# Patient Record
Sex: Male | Born: 1970
Health system: Southern US, Community
[De-identification: ages and names within clinical notes are randomized; demographics above are authoritative.]

## PROBLEM LIST (undated history)

## (undated) DIAGNOSIS — I10 Essential (primary) hypertension: Secondary | ICD-10-CM

## (undated) DIAGNOSIS — K219 Gastro-esophageal reflux disease without esophagitis: Secondary | ICD-10-CM

## (undated) DIAGNOSIS — D649 Anemia, unspecified: Secondary | ICD-10-CM

## (undated) HISTORY — DX: Essential (primary) hypertension: I10

## (undated) HISTORY — DX: Anemia, unspecified: D64.9

## (undated) HISTORY — DX: Gastro-esophageal reflux disease without esophagitis: K21.9

## (undated) HISTORY — PX: NO PAST SURGERIES: SHX2092

---

## 2006-02-05 ENCOUNTER — Emergency Department (HOSPITAL_COMMUNITY): Admission: EM | Admit: 2006-02-05 | Discharge: 2006-02-05 | Payer: Self-pay | Admitting: Emergency Medicine

## 2006-10-04 ENCOUNTER — Emergency Department (HOSPITAL_COMMUNITY): Admission: EM | Admit: 2006-10-04 | Discharge: 2006-10-04 | Payer: Self-pay | Admitting: Emergency Medicine

## 2006-11-01 ENCOUNTER — Emergency Department (HOSPITAL_COMMUNITY): Admission: EM | Admit: 2006-11-01 | Discharge: 2006-11-01 | Payer: Self-pay | Admitting: Family Medicine

## 2006-12-22 ENCOUNTER — Emergency Department (HOSPITAL_COMMUNITY): Admission: EM | Admit: 2006-12-22 | Discharge: 2006-12-22 | Payer: Self-pay | Admitting: Emergency Medicine

## 2007-02-10 ENCOUNTER — Emergency Department (HOSPITAL_COMMUNITY): Admission: EM | Admit: 2007-02-10 | Discharge: 2007-02-10 | Payer: Self-pay | Admitting: Emergency Medicine

## 2007-05-15 ENCOUNTER — Emergency Department (HOSPITAL_COMMUNITY): Admission: EM | Admit: 2007-05-15 | Discharge: 2007-05-15 | Payer: Self-pay | Admitting: Emergency Medicine

## 2008-02-29 ENCOUNTER — Ambulatory Visit: Payer: Self-pay | Admitting: Internal Medicine

## 2008-03-05 LAB — CBC WITH DIFFERENTIAL/PLATELET
Basophils Absolute: 0 10*3/uL (ref 0.0–0.1)
HGB: 12.9 g/dL — ABNORMAL LOW (ref 13.0–17.1)
LYMPH%: 35.1 % (ref 14.0–48.0)
MCV: 91.3 fL (ref 81.6–98.0)
MONO%: 9.8 % (ref 0.0–13.0)
NEUT#: 3.2 10*3/uL (ref 1.5–6.5)
RBC: 4.13 10*6/uL — ABNORMAL LOW (ref 4.20–5.71)
RDW: 13 % (ref 11.2–14.6)
WBC: 5.9 10*3/uL (ref 4.0–10.0)

## 2008-03-05 LAB — COMPREHENSIVE METABOLIC PANEL
ALT: 43 U/L (ref 0–53)
AST: 79 U/L — ABNORMAL HIGH (ref 0–37)
Albumin: 4.6 g/dL (ref 3.5–5.2)
Alkaline Phosphatase: 61 U/L (ref 39–117)
BUN: 11 mg/dL (ref 6–23)
Calcium: 9.3 mg/dL (ref 8.4–10.5)
Glucose, Bld: 80 mg/dL (ref 70–99)

## 2008-03-07 ENCOUNTER — Ambulatory Visit (HOSPITAL_COMMUNITY): Admission: RE | Admit: 2008-03-07 | Discharge: 2008-03-07 | Payer: Self-pay | Admitting: Internal Medicine

## 2008-03-21 LAB — FECAL OCCULT BLOOD, GUAIAC: Occult Blood: NEGATIVE

## 2008-03-21 LAB — CBC WITH DIFFERENTIAL/PLATELET
Eosinophils Absolute: 0.1 10*3/uL (ref 0.0–0.5)
HCT: 38.1 % — ABNORMAL LOW (ref 38.7–49.9)
MCH: 31.1 pg (ref 28.0–33.4)
MONO#: 0.5 10*3/uL (ref 0.1–0.9)
RBC: 4.15 10*6/uL — ABNORMAL LOW (ref 4.20–5.71)
RDW: 13.1 % (ref 11.2–14.6)
WBC: 5.1 10*3/uL (ref 4.0–10.0)

## 2008-03-21 LAB — LACTATE DEHYDROGENASE: LDH: 160 U/L (ref 94–250)

## 2008-03-25 LAB — HEMOGLOBINOPATHY EVALUATION
Hgb A2 Quant: 2.3 % (ref 2.2–3.2)
Hgb F Quant: 0 % (ref 0.0–2.0)
Hgb S Quant: 0 % (ref 0.0–0.0)

## 2008-05-29 ENCOUNTER — Emergency Department (HOSPITAL_COMMUNITY): Admission: EM | Admit: 2008-05-29 | Discharge: 2008-05-29 | Payer: Self-pay | Admitting: Emergency Medicine

## 2008-07-04 ENCOUNTER — Emergency Department (HOSPITAL_COMMUNITY): Admission: EM | Admit: 2008-07-04 | Discharge: 2008-07-04 | Payer: Self-pay | Admitting: Emergency Medicine

## 2008-08-16 ENCOUNTER — Ambulatory Visit: Payer: Self-pay | Admitting: Internal Medicine

## 2009-09-09 IMAGING — CT CT CHEST W/ CM
2 of 5 series · 17 of 46 positions shown, 19 images · IV contrast (agent unspecified)
Comparison: None

CT CHEST

CLINICAL DATA: Diffuse lymphadenopathy.

CT CHEST, ABDOMEN AND PELVIS WITH CONTRAST
TECHNIQUE: Multidetector CT imaging of the chest, abdomen and
pelvis was performed following the standard protocol during bolus
administration of intravenous contrast.
Contrast: 100 ml [id number].

[Series 2: cap 5.0 b40f · axial · 0.80mm/px · z∈[-683,-118]mm · 14 of 129 slices shown, 16 images]
[im 8/129  soft-tissue]
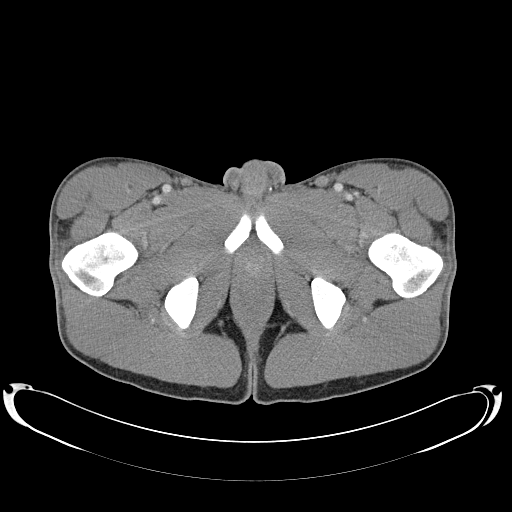
[im 8/129  bone]
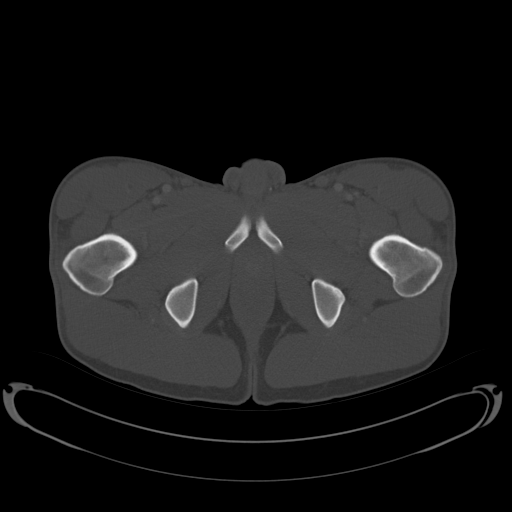
[im 15/129  soft-tissue]
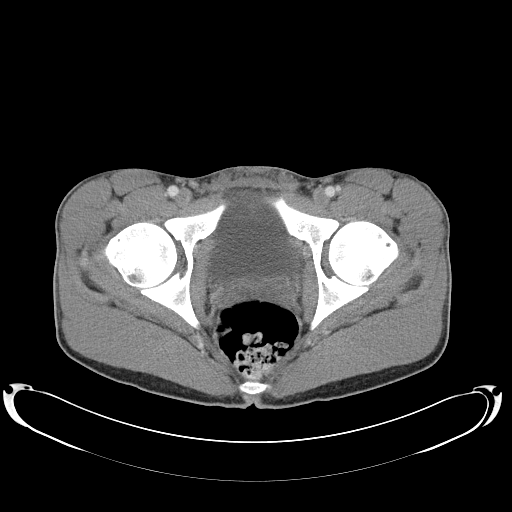
[im 29/129  soft-tissue]
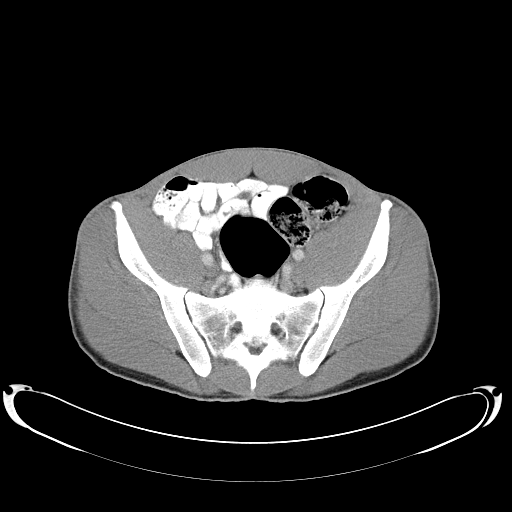
[im 36/129  soft-tissue]
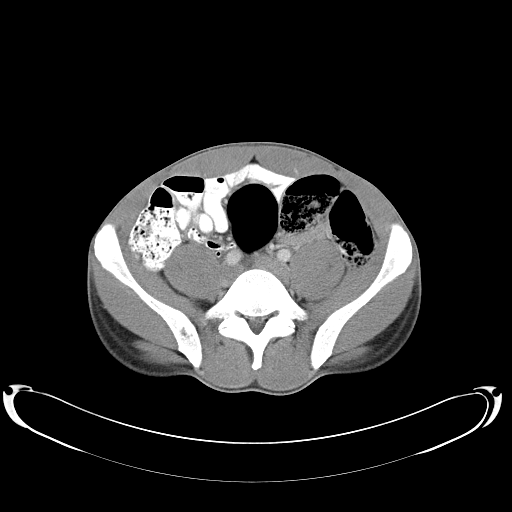
[im 43/129  soft-tissue]
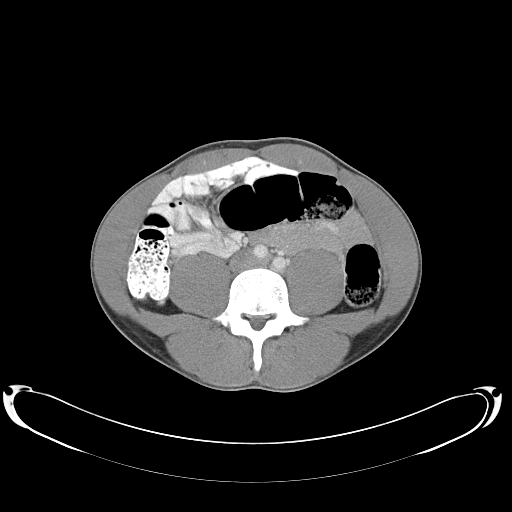
[im 50/129  soft-tissue]
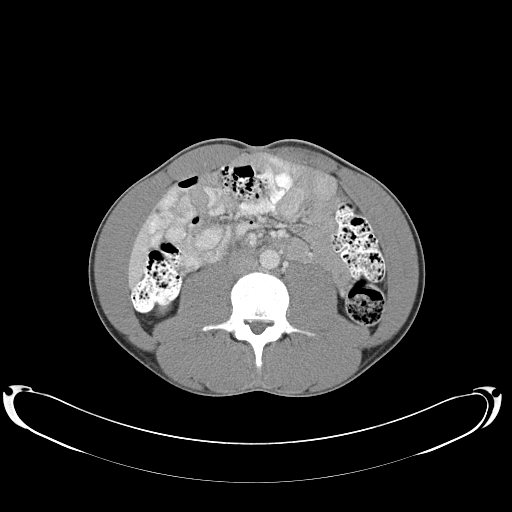
[im 57/129  soft-tissue]
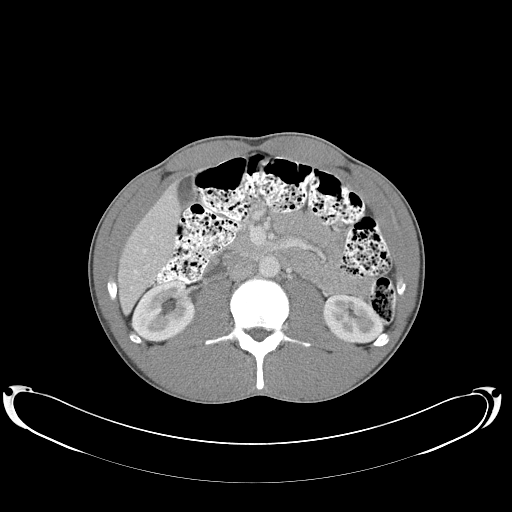
[im 72/129  soft-tissue]
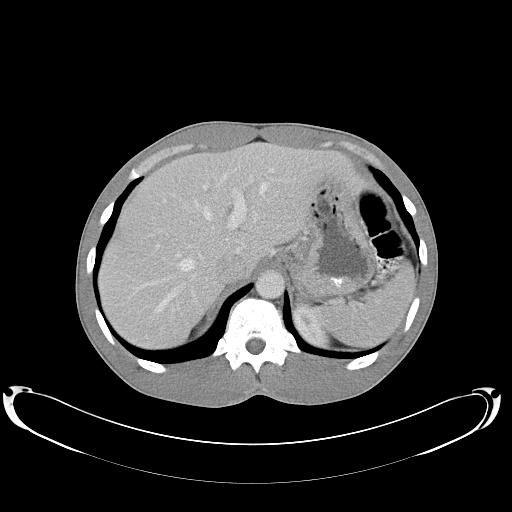
[im 79/129  soft-tissue]
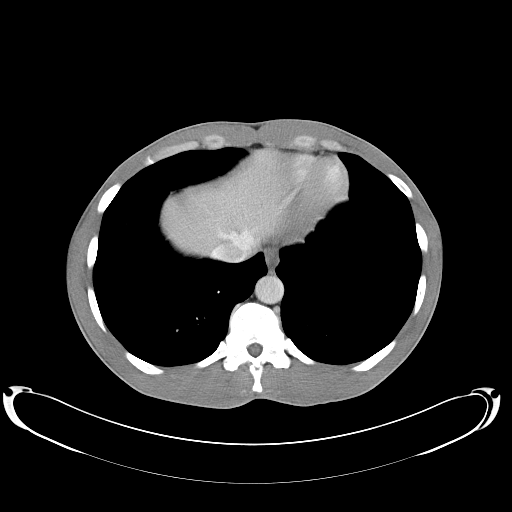
[im 79/129  bone]
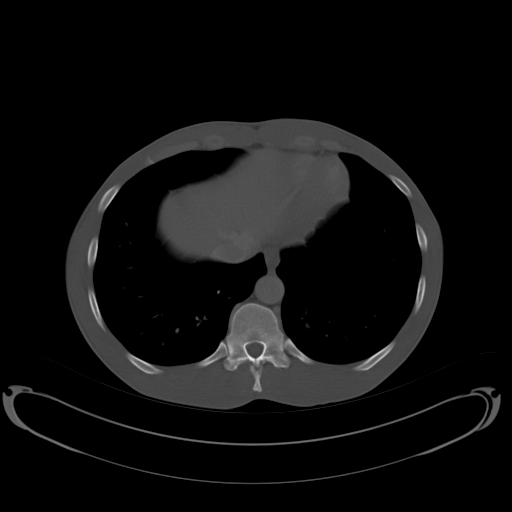
[im 86/129  soft-tissue]
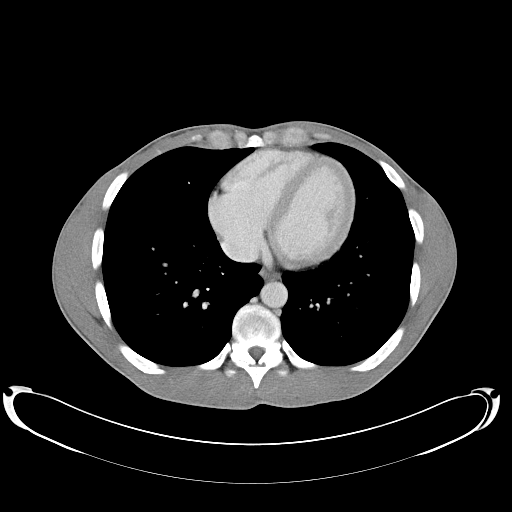
[im 93/129  soft-tissue]
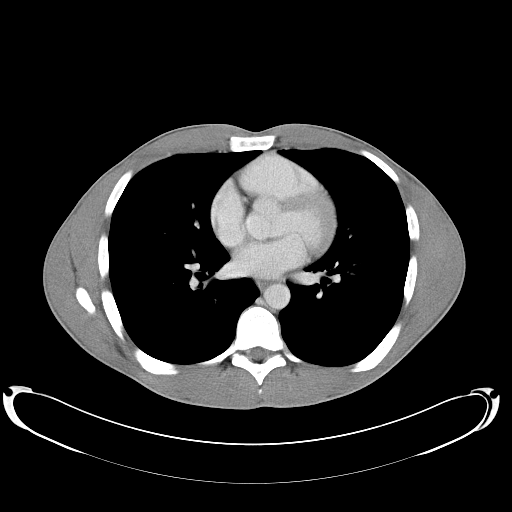
[im 100/129  soft-tissue]
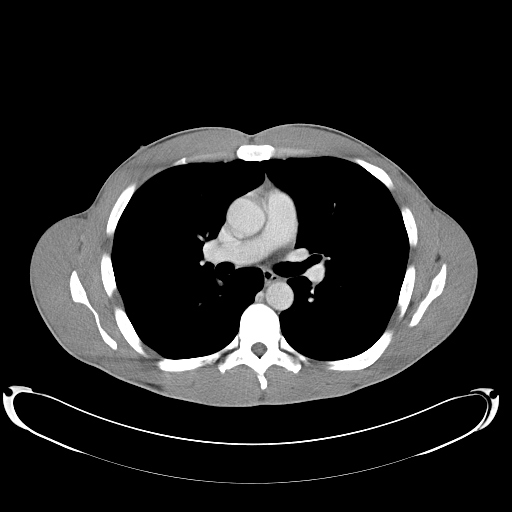
[im 114/129  soft-tissue]
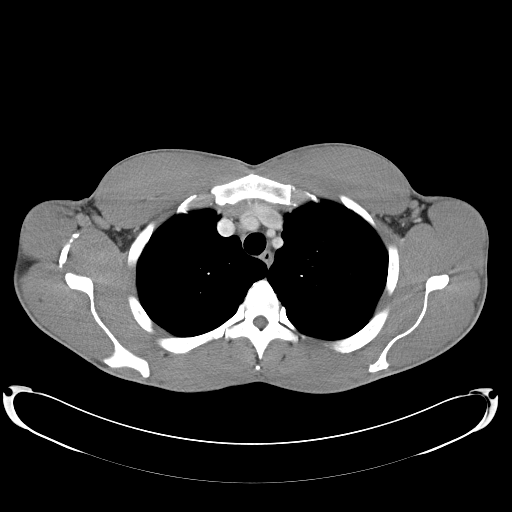
[im 121/129  soft-tissue]
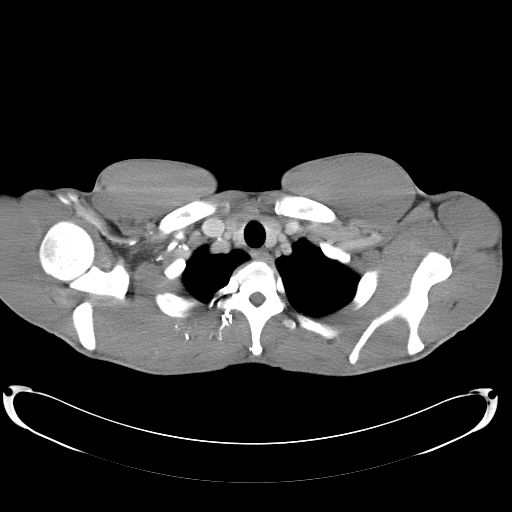

[Series 602: <mpr thick range> · coronal · 1.26mm/px · 3 of 70 slices shown]
[im 24/70  soft-tissue]
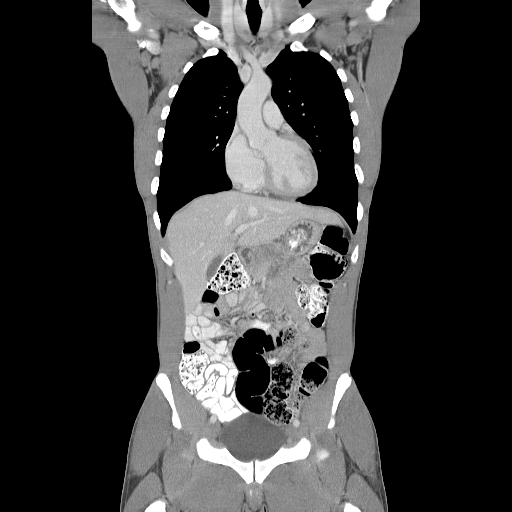
[im 31/70  soft-tissue]
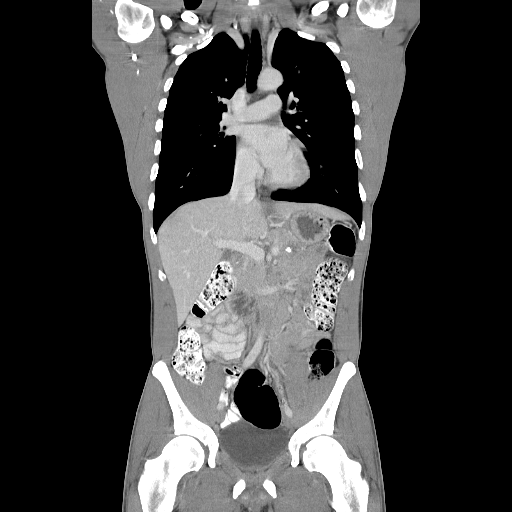
[im 39/70  soft-tissue]
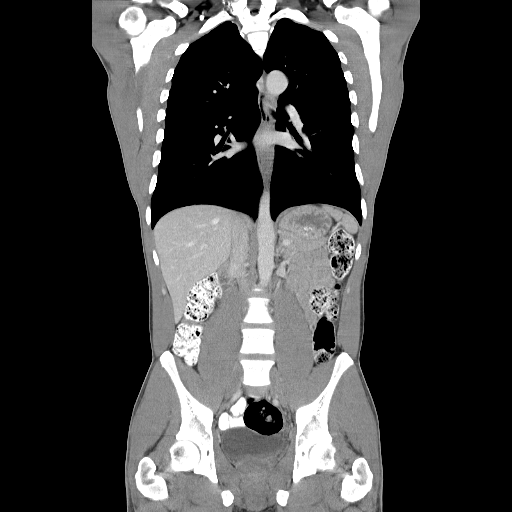

[17 of 46 positions shown; findings below may reference images not displayed]

FINDINGS: No pathologically enlarged mediastinal or hilar lymph
nodes.Supraclavicular regions unremarkable.  Slightly prominent
sized biaxillary nodes.  There is a 1.3 centimeter right axillary
node (coronal image 24) and a 1 cm left axillary node (image 22).
No pericardial or pleural effusions.  No pulmonary nodules or
masses.  No infiltrates.  Bony thorax unremarkable
IMPRESSION: Mild biaxillary adenopathy.  No mediastinal or hilar adenopathy.

CT ABDOMEN
FINDINGS: The liver, spleen, kidneys, adrenal glands, and pancreas
appear unremarkable.  No pathologically enlarged abdominal lymph
nodes.  Mesenteric regions unremarkable.  No ascites.  Lumbar spine
unremarkable
IMPRESSION: Unremarkable abdominal CT scan.

CT PELVIS
FINDINGS: No pelvic mass or pathologically enlarged intrapelvic
lymph nodes. Shotty- like nodes are noted in the inguinofemoral
regions.  There is a 8 mm diameter left groin node (image 122).
There is a 7 mm right groin node(image 123). Pelvic sidewalls well-
defined.  Bladder unremarkable.  Prostate gland unremarkable
IMPRESSION: No pathologically enlarged pelvic lymph nodes.  Slightly prominent
sized but not pathologically enlarged inguinofemoral nodes.

## 2010-02-14 ENCOUNTER — Emergency Department (HOSPITAL_COMMUNITY): Admission: EM | Admit: 2010-02-14 | Discharge: 2010-02-14 | Payer: Self-pay | Admitting: Emergency Medicine

## 2012-02-07 ENCOUNTER — Ambulatory Visit: Payer: Self-pay | Admitting: Internal Medicine

## 2012-02-07 DIAGNOSIS — Z0289 Encounter for other administrative examinations: Secondary | ICD-10-CM

## 2012-03-24 ENCOUNTER — Emergency Department (HOSPITAL_COMMUNITY)
Admission: EM | Admit: 2012-03-24 | Discharge: 2012-03-24 | Disposition: A | Discharge status: Home / Self Care | Payer: Self-pay | Attending: Emergency Medicine | Admitting: Emergency Medicine

## 2012-03-24 ENCOUNTER — Emergency Department (HOSPITAL_COMMUNITY): Payer: Self-pay

## 2012-03-24 ENCOUNTER — Encounter (HOSPITAL_COMMUNITY): Payer: Self-pay | Admitting: *Deleted

## 2012-03-24 DIAGNOSIS — S060XAA Concussion with loss of consciousness status unknown, initial encounter: Secondary | ICD-10-CM | POA: Insufficient documentation

## 2012-03-24 DIAGNOSIS — S060X9A Concussion with loss of consciousness of unspecified duration, initial encounter: Secondary | ICD-10-CM | POA: Insufficient documentation

## 2012-03-24 DIAGNOSIS — S199XXA Unspecified injury of neck, initial encounter: Secondary | ICD-10-CM | POA: Insufficient documentation

## 2012-03-24 DIAGNOSIS — Y9269 Other specified industrial and construction area as the place of occurrence of the external cause: Secondary | ICD-10-CM | POA: Insufficient documentation

## 2012-03-24 DIAGNOSIS — S0993XA Unspecified injury of face, initial encounter: Secondary | ICD-10-CM | POA: Insufficient documentation

## 2012-03-24 DIAGNOSIS — IMO0002 Reserved for concepts with insufficient information to code with codable children: Secondary | ICD-10-CM | POA: Insufficient documentation

## 2012-03-24 MED ORDER — ACETAMINOPHEN 325 MG PO TABS
650.0000 mg | ORAL_TABLET | Freq: Once | ORAL | Status: AC
  Administered 2012-03-24: 650 mg via ORAL
  Filled 2012-03-24 (×2): qty 1

## 2012-03-24 NOTE — Discharge Instructions (Signed)
Blunt Trauma You have been evaluated for injuries. You have been examined and your caregiver has not found injuries serious enough to require hospitalization. It is common to have multiple bruises and sore muscles following an accident. These tend to feel worse for the first 24 hours. You will feel more stiffness and soreness over the next several hours and worse when you wake up the first morning after your accident. After this point, you should begin to improve with each passing day. The amount of improvement depends on the amount of damage done in the accident. Following your accident, if some part of your body does not work as it should, or if the pain in any area continues to increase, you should return to the Emergency Department for re-evaluation.  HOME CARE INSTRUCTIONS  Routine care for sore areas should include:  Ice to sore areas every 2 hours for 20 minutes while awake for the next 2 days.   Drink extra fluids (not alcohol).   Take a hot or warm shower or bath once or twice a day to increase blood flow to sore muscles. This will help you "limber up".   Activity as tolerated. Lifting may aggravate neck or back pain.   Only take over-the-counter or prescription medicines for pain, discomfort, or fever as directed by your caregiver. Do not use aspirin. This may increase bruising or increase bleeding if there are small areas where this is happening.  SEEK IMMEDIATE MEDICAL CARE IF:  Numbness, tingling, weakness, or problem with the use of your arms or legs.   A severe headache is not relieved with medications.   There is a change in bowel or bladder control.   Increasing pain in any areas of the body.   Short of breath or dizzy.   Nauseated, vomiting, or sweating.   Increasing belly (abdominal) discomfort.   Blood in urine, stool, or vomiting blood.   Pain in either shoulder in an area where a shoulder strap would be.   Feelings of lightheadedness or if you have a fainting  episode.  Sometimes it is not possible to identify all injuries immediately after the trauma. It is important that you continue to monitor your condition after the emergency department visit. If you feel you are not improving, or improving more slowly than should be expected, call your physician. If you feel your symptoms (problems) are worsening, return to the Emergency Department immediately. Document Released: 07/07/2001 Document Revised: 09/30/2011 Document Reviewed: 05/29/2008 [facility] Patient Information 2012 [place], [place].Nasal Injuries Nasal injuries can result in damage to the skin, bone, cartilage or a combination of these. If the skin is cut, it requires cleaning. If the skin is cut, it may need to be closed. Caregivers close these cuts using either stitches (sutures) or skin adhesive strips. You may need an injection to prevent tetanus if your shots are not up to date (tetanus shot every 10 years). Broken noses (fractures) make up nearly half of all facial fractures. Fractures to the nasal bones cause a lot of swelling. When you have a fracture to the nasal bone, it may take about 5 days for the swelling to go down enough for the bones to be checked by your caregiver. Have your caregiver check your nose to see if further treatment is required. DIAGNOSIS  X-rays often do not help in the diagnosis of nasal fractures. The majority of nasal fractures are simple and uncomplicated fractures. X-rays are important only when more serious injuries have taken place. If this happens,  you may need x-rays of the skull and face. TREATMENT  Most of the time when the swelling has gone down, there is either no or little change in the shape of the nose. If the nose is deformed after the swelling has subsided (5-7 days after injury), straightening (manipulation) can be done. Even after manipulation, the deformity may not be corrected completely. Any left over deformity of the nose after manipulation may require  more surgery. COMPLICATIONS  Septal hematoma. This happens when blood collecting (hematoma) under the lining (mucosa) of the central partition wall of the nose (septum) is stripped off of either side. This causes a purple swelling inside the nose. If this occurs, it will give you a plugged nose and pain. It will need treatment by draining the blood away. It should be drained soon after it happened. If the blood is left and not drained, it can become infected or cause damage to the underlying support. It can destroy cartilage. It can then leave the patient with a "saddle nose" deformity. Treatment is by surgical drainage. The patient usually requires a dose of medications that kill bacteria (antibiotics).   Nasal obstruction. Nasal blockage usually occurs after the injury. It is due to swelling inside the nose. It may take a few days to get better. If the nose is still blocked after 3 weeks, it may be due to a bent (deviated) or buckled septum. This blocks the nasal passage. If you have a septal deviation and nasal blockage, you may require surgery to straighten this out.   Epistaxis. Nosebleeds are common. They usually get better on their own and with simple first aid. Simple first aid for nosebleeds includes gently pinching the lower half of the nose for fifteen minutes. Usually caregivers do not perform nasal packing or cautery (a chemical or heat burning) unless the nosebleed does not stop on its own.  RARE COMPLICATIONS  Cerebrospinal fluid leak (CSF). Severe nasal trauma (damage caused by an accident) can push the nasal bones into the face. This gives the face a pug-like appearance. The thin boney plate at the roof of the nose may fracture. This causes the cerebrospinal fluid (CSF) to leak out. Small fractures seal spontaneously with conservative care. 95% of these small fractures seal within 2 weeks. Caregivers usually do not give antibiotics unless infection is present. If CSF continues to leak,  more treatment may be required.   Anosmia. The smell organ in the roof of the nose can also be damaged. Loss of sense of smell very rarely returns.  SEEK MEDICAL CARE IF: Pain increases or you continue to have nosebleeds. SEEK IMMEDIATE MEDICAL CARE IF:   You have bleeding from your nose that does not stop after 20 minutes of pinching the nostrils closed and keeping ice on the nose.   You have clear fluid draining from your nose.   You notice swelling on the dividing wall between the nostrils (septum).  Document Released: 12/20/2001 Document Revised: 06/23/2011 Document Reviewed: 10/11/2005 [facility] Patient Information 2012 [place], [place].

## 2012-03-24 NOTE — ED Notes (Signed)
Pt was at work.  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  Pt has swelling, pain and lac to bridge of nose.  Bleeding controlled.  Denies LOC.  Feels dizzy.

## 2012-03-24 NOTE — ED Provider Notes (Signed)
History     CSN: [id number]  Arrival date & time 03/24/12  1745   First MD Initiated Contact with Patient 03/24/12 1840      Chief Complaint  Patient presents with  . Facial Injury    (Consider location/radiation/quality/duration/timing/severity/associated sxs/prior treatment) HPI  Patient presents to the emergency department with complaints of facial injury at work today. 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 He is a small abrasion his nasal bridge. The patient does have some nasal swelling. The wife states that he is having pain to his face for he states that he did have a mild headache but no change in vision. He denies loss of consciousness, syncope, any other injury. The patient is ambulating without difficulty. This episode happened earlier today around 2 PM. She is in no acute distress her vital signs are stable  History reviewed. No pertinent past medical history.  History reviewed. No pertinent past surgical history.  History reviewed. No pertinent family history.  History  Substance Use Topics  . Smoking status: Not on file  . Smokeless tobacco: Not on file  . Alcohol Use: Yes      Review of Systems   HEENT: denies blurry vision or change in hearing PULMONARY: Denies difficulty breathing and SOB CARDIAC: denies chest pain or heart palpitations MUSCULOSKELETAL:  denies being unable to ambulate ABDOMEN AL: denies abdominal pain GU: denies loss of bowel or urinary control NEURO: denies numbness and tingling in extremities SKIN: no new rashes PSYCH: patient behavior is normal NECK: Not complaining of neck pain     Allergies  Review of patient's allergies indicates no known allergies.  Home Medications   Current Outpatient Rx  Name Route Sig Dispense Refill  . ADULT MULTIVITAMIN W/MINERALS CH Oral Take 1 tablet by mouth daily.      BP 137/98  Pulse 67  Temp(Src) 98.6 F (37 C) (Oral)   Resp 18  Ht 5\' 9"  (1.753 m)  Wt 165 lb (74.844 kg)  BMI 24.37 kg/m2  SpO2 96%  Physical Exam  Nursing note and vitals reviewed. Constitutional: He appears well-developed and well-nourished. No distress.  HENT:  Head: Normocephalic. Not macrocephalic and not microcephalic. Head is with abrasion (to nasal bridge) and with contusion (to nose). Head is without raccoon's eyes, without Battle's sign, without laceration, without right periorbital erythema and without left periorbital erythema. Hair is normal.  Right Ear: Tympanic membrane and ear canal normal.  Left Ear: Tympanic membrane and ear canal normal.  Eyes: Pupils are equal, round, and reactive to light.  Neck: Trachea normal and normal range of motion. Neck supple. No spinous process tenderness and no muscular tenderness present. No rigidity. Normal range of motion present.  Cardiovascular: Normal rate and regular rhythm.   Pulmonary/Chest: Effort normal.  Abdominal: Soft.  Neurological: He is alert. He has normal strength. No cranial nerve deficit (3-23 intact) or sensory deficit. He displays a negative Romberg sign.  Skin: Skin is warm and dry.    ED Course  Procedures (including critical care time)  Labs Reviewed - No data to display Ct Maxillofacial Wo Cm  03/24/2012  *RADIOLOGY REPORT*  Clinical Data: Hit nose.  Laceration.  CT MAXILLOFACIAL WITHOUT CONTRAST  Technique:  Multidetector CT imaging of the maxillofacial structures was performed. Multiplanar CT image reconstructions were also generated.  Comparison: CT head 12/22/2006.  Findings: No facial orbital fracture.  Zygomatic arches and mandible intact.  Near complete  opacification of the left frontal sinus.  This likely is related to chronic sinusitis, stable since prior study.  No air fluid levels.  Orbital soft tissues are unremarkable.  IMPRESSION: No evidence of facial or orbital fracture.  Stable near complete opacification of the left frontal sinuses.  Original Report  Authenticated By: Cyndie Chime, M.D.     1. Facial injury   2. Concussion       MDM  Maxillofacial CT normal. Patient neuro exam normal.  Patient with back pain. No neurological deficits. Patient is ambulatory. No warning symptoms of back pain including: loss of bowel or bladder control,  No concern for cauda equina other serious cause of back pain. Conservative measures such as rest, ice/heat and pain medicine indicated with PCP follow-up if no improvement with conservative management.           Dorthula Matas, PA 03/24/12 2012

## 2012-03-24 NOTE — ED Notes (Signed)
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 States hit nose on equipment. Pt has small lac to bridge of nose. C/o headache, pain behind eyes, and dizziness. Denies loss of consciousness.

## 2012-03-25 NOTE — ED Provider Notes (Signed)
Medical screening examination/treatment/procedure(s) were performed by non-physician practitioner and as supervising physician I was immediately available for consultation/collaboration.   Gwyneth Sprout, MD 03/25/12 2321

## 2012-08-03 ENCOUNTER — Ambulatory Visit (INDEPENDENT_AMBULATORY_CARE_PROVIDER_SITE_OTHER): Payer: BC Managed Care – PPO | Admitting: Internal Medicine

## 2012-08-03 ENCOUNTER — Encounter: Payer: Self-pay | Admitting: Internal Medicine

## 2012-08-03 ENCOUNTER — Other Ambulatory Visit (INDEPENDENT_AMBULATORY_CARE_PROVIDER_SITE_OTHER): Payer: BC Managed Care – PPO

## 2012-08-03 VITALS — BP 120/72 | HR 75 | Temp 98.7°F | Resp 16 | Wt 171.0 lb

## 2012-08-03 DIAGNOSIS — Z1211 Encounter for screening for malignant neoplasm of colon: Secondary | ICD-10-CM | POA: Insufficient documentation

## 2012-08-03 DIAGNOSIS — R591 Generalized enlarged lymph nodes: Secondary | ICD-10-CM

## 2012-08-03 DIAGNOSIS — R599 Enlarged lymph nodes, unspecified: Secondary | ICD-10-CM

## 2012-08-03 DIAGNOSIS — Z0000 Encounter for general adult medical examination without abnormal findings: Secondary | ICD-10-CM

## 2012-08-03 LAB — COMPREHENSIVE METABOLIC PANEL
AST: 31 U/L (ref 0–37)
Albumin: 4.1 g/dL (ref 3.5–5.2)
Alkaline Phosphatase: 48 U/L (ref 39–117)
CO2: 30 mEq/L (ref 19–32)
Calcium: 9.7 mg/dL (ref 8.4–10.5)
Chloride: 105 mEq/L (ref 96–112)
Total Bilirubin: 1.1 mg/dL (ref 0.3–1.2)
Total Protein: 7.3 g/dL (ref 6.0–8.3)

## 2012-08-03 LAB — URINALYSIS, ROUTINE W REFLEX MICROSCOPIC
Bilirubin Urine: NEGATIVE
Leukocytes, UA: NEGATIVE
Total Protein, Urine: NEGATIVE
Urobilinogen, UA: 0.2 (ref 0.0–1.0)
pH: 6 (ref 5.0–8.0)

## 2012-08-03 LAB — CBC WITH DIFFERENTIAL/PLATELET
Basophils Absolute: 0 10*3/uL (ref 0.0–0.1)
Hemoglobin: 14 g/dL (ref 13.0–17.0)
Lymphs Abs: 1.3 10*3/uL (ref 0.7–4.0)
MCHC: 33.5 g/dL (ref 30.0–36.0)
MCV: 92.4 fl (ref 78.0–100.0)
Monocytes Absolute: 0.5 10*3/uL (ref 0.1–1.0)
Neutro Abs: 3.2 10*3/uL (ref 1.4–7.7)
Platelets: 162 10*3/uL (ref 150.0–400.0)

## 2012-08-03 LAB — LIPID PANEL: VLDL: 29.6 mg/dL (ref 0.0–40.0)

## 2012-08-03 LAB — TSH: TSH: 3.58 u[IU]/mL (ref 0.35–5.50)

## 2012-08-03 LAB — HIV ANTIBODY (ROUTINE TESTING W REFLEX): HIV: NONREACTIVE

## 2012-08-03 LAB — C-REACTIVE PROTEIN: CRP: 0.8 mg/dL (ref 0.5–20.0)

## 2012-08-03 NOTE — Assessment & Plan Note (Signed)
Exam done, labs ordered, pt ed material was given 

## 2012-08-03 NOTE — Progress Notes (Signed)
Subjective:    Patient ID: John Chen, male    DOB: 1971/01/11, 41 y.o.   MRN: [id number]  HPI  New to me , he came in for a physical but he also reports intermittent episodes of painful/swollen lymph nodes in his neck and groin areas.  Review of Systems  Constitutional: Negative.  Negative for fever, chills, diaphoresis, activity change, appetite change, fatigue and unexpected weight change.  HENT: Negative.  Negative for sore throat and mouth sores.   Eyes: Negative.   Respiratory: Negative for cough, chest tightness, shortness of breath, wheezing and stridor.   Cardiovascular: Negative for chest pain, palpitations and leg swelling.  Gastrointestinal: Negative for nausea, abdominal pain, diarrhea, constipation and blood in stool.  Genitourinary: Negative for dysuria, urgency, frequency, hematuria, flank pain, decreased urine volume, discharge, penile swelling, scrotal swelling, enuresis, difficulty urinating, genital sores, penile pain and testicular pain.  Musculoskeletal: Negative for myalgias, back pain, joint swelling, arthralgias and gait problem.  Skin: Negative for color change, pallor, rash and wound.  Neurological: Negative.   Hematological: Positive for adenopathy. Does not bruise/bleed easily.  Psychiatric/Behavioral: Negative.        Objective:   Physical Exam  Vitals reviewed. Constitutional: He is oriented to person, place, and time. Vital signs are normal. He appears well-developed and well-nourished.  Non-toxic appearance. He does not have a sickly appearance. He does not appear ill. No distress.  HENT:  Head: Normocephalic and atraumatic.  Mouth/Throat: Oropharynx is clear and moist. No oropharyngeal exudate.  Eyes: Conjunctivae normal are normal. Right eye exhibits no discharge. Left eye exhibits no discharge. No scleral icterus.  Neck: Normal range of motion. Neck supple. No JVD present. No tracheal deviation present. No thyromegaly present.  Cardiovascular:  Normal rate, regular rhythm, normal heart sounds and intact distal pulses.  Exam reveals no gallop and no friction rub.   No murmur heard. Pulmonary/Chest: Effort normal and breath sounds normal. No stridor. No respiratory distress. He has no wheezes. He has no rales. He exhibits no tenderness.  Abdominal: Soft. Bowel sounds are normal. He exhibits no distension and no mass. There is no tenderness. There is no rebound and no guarding. Hernia confirmed negative in the right inguinal area and confirmed negative in the left inguinal area.  Genitourinary: Rectum normal, prostate normal, testes normal and penis normal. Rectal exam shows no external hemorrhoid, no internal hemorrhoid, no fissure, no mass, no tenderness and anal tone normal. Guaiac negative stool. Prostate is not enlarged and not tender. Right testis shows no mass, no swelling and no tenderness. Right testis is descended. Left testis shows no mass, no swelling and no tenderness. Left testis is descended. Circumcised. No penile erythema or penile tenderness. No discharge found.  Musculoskeletal: Normal range of motion. He exhibits no edema and no tenderness.  Lymphadenopathy:       Head (right side): No submental, no submandibular, no tonsillar, no preauricular, no posterior auricular and no occipital adenopathy present.       Head (left side): No submental, no submandibular, no tonsillar, no preauricular, no posterior auricular and no occipital adenopathy present.    He has no cervical adenopathy.       Right cervical: No superficial cervical, no deep cervical and no posterior cervical adenopathy present.      Left cervical: No superficial cervical, no deep cervical and no posterior cervical adenopathy present.    He has no axillary adenopathy.       Right axillary: No pectoral and no lateral adenopathy  present.       Left axillary: No pectoral and no lateral adenopathy present.      Right: Inguinal (shoddy, scattered, insignificant)  adenopathy present. No supraclavicular and no epitrochlear adenopathy present.       Left: Inguinal (shoddy, scattered, insignificant) adenopathy present. No supraclavicular and no epitrochlear adenopathy present.  Neurological: He is oriented to person, place, and time.  Skin: Skin is warm and dry. No rash noted. He is not diaphoretic. No erythema. No pallor.  Psychiatric: He has a normal mood and affect. His behavior is normal. Judgment and thought content normal.          Assessment & Plan:

## 2012-08-03 NOTE — Assessment & Plan Note (Signed)
He complains of LAD but he has no constitutional symptoms and his lymph nodes are benign, I will check his labs to look for lymphoproliferative process, inflammation, HIV, etc

## 2012-08-03 NOTE — Patient Instructions (Signed)
Lymphadenopathy Lymphadenopathy means "disease of the lymph glands." But the term is usually used to describe swollen or enlarged lymph glands, also called lymph nodes. These are the bean-shaped organs found in many locations including the neck, underarm, and groin. Lymph glands are part of the immune system, which fights infections in your body. Lymphadenopathy can occur in just one area of the body, such as the neck, or it can be generalized, with lymph node enlargement in several areas. The nodes found in the neck are the most common sites of lymphadenopathy. CAUSES  When your immune system responds to germs (such as viruses or bacteria ), infection-fighting cells and fluid build up. This causes the glands to grow in size. This is usually not something to worry about. Sometimes, the glands themselves can become infected and inflamed. This is called lymphadenitis. Enlarged lymph nodes can be caused by many diseases:  Bacterial disease, such as strep throat or a skin infection.  Viral disease, such as a common cold.  Other germs, such as lyme disease, tuberculosis, or sexually transmitted diseases.  Cancers, such as lymphoma (cancer of the lymphatic system) or leukemia (cancer of the white blood cells).  Inflammatory diseases such as lupus or rheumatoid arthritis.  Reactions to medications. Many of the diseases above are rare, but important. This is why you should see your caregiver if you have lymphadenopathy. SYMPTOMS   Swollen, enlarged lumps in the neck, back of the head or other locations.  Tenderness.  Warmth or redness of the skin over the lymph nodes.  Fever. DIAGNOSIS  Enlarged lymph nodes are often near the source of infection. They can help healthcare providers diagnose your illness. For instance:   Swollen lymph nodes around the jaw might be caused by an infection in the mouth.  Enlarged glands in the neck often signal a throat infection.  Lymph nodes that are swollen  in more than one area often indicate an illness caused by a virus. Your caregiver most likely will know what is causing your lymphadenopathy after listening to your history and examining you. Blood tests, x-rays or other tests may be needed. If the cause of the enlarged lymph node cannot be found, and it does not go away by itself, then a biopsy may be needed. Your caregiver will discuss this with you. TREATMENT  Treatment for your enlarged lymph nodes will depend on the cause. Many times the nodes will shrink to normal size by themselves, with no treatment. Antibiotics or other medicines may be needed for infection. Only take over-the-counter or prescription medicines for pain, discomfort or fever as directed by your caregiver. HOME CARE INSTRUCTIONS  Swollen lymph glands usually return to normal when the underlying medical condition goes away. If they persist, contact your health-care provider. He/she might prescribe antibiotics or other treatments, depending on the diagnosis. Take any medications exactly as prescribed. Keep any follow-up appointments made to check on the condition of your enlarged nodes.  SEEK MEDICAL CARE IF:   Swelling lasts for more than two weeks.  You have symptoms such as weight loss, night sweats, fatigue or fever that does not go away.  The lymph nodes are hard, seem fixed to the skin or are growing rapidly.  Skin over the lymph nodes is red and inflamed. This could mean there is an infection. SEEK IMMEDIATE MEDICAL CARE IF:   Fluid starts leaking from the area of the enlarged lymph node.  You develop a fever of 102 F (38.9 C) or greater.  Severe  pain develops (not necessarily at the site of a large lymph node).  You develop chest pain or shortness of breath.  You develop worsening abdominal pain. MAKE SURE YOU:   Understand these instructions.  Will watch your condition.  Will get help right away if you are not doing well or get worse. Document  Released: 07/20/2008 Document Revised: 01/03/2012 Document Reviewed: 07/20/2008 [facility] Patient Information 2013 [place], [place]. Health Maintenance, Males A healthy lifestyle and preventative care can promote health and wellness.  Maintain regular health, dental, and eye exams.  Eat a healthy diet. Foods like vegetables, fruits, whole grains, low-fat dairy products, and lean protein foods contain the nutrients you need without too many calories. Decrease your intake of foods high in solid fats, added sugars, and salt. Get information about a proper diet from your caregiver, if necessary.  Regular physical exercise is one of the most important things you can do for your health. Most adults should get at least 150 minutes of moderate-intensity exercise (any activity that increases your heart rate and causes you to sweat) each week. In addition, most adults need muscle-strengthening exercises on 2 or more days a week.   Maintain a healthy weight. The body mass index (BMI) is a screening tool to identify possible weight problems. It provides an estimate of body fat based on height and weight. Your caregiver can help determine your BMI, and can help you achieve or maintain a healthy weight. For adults 20 years and older:  A BMI below 18.5 is considered underweight.  A BMI of 18.5 to 24.9 is normal.  A BMI of 25 to 29.9 is considered overweight.  A BMI of 30 and above is considered obese.  Maintain normal blood lipids and cholesterol by exercising and minimizing your intake of saturated fat. Eat a balanced diet with plenty of fruits and vegetables. Blood tests for lipids and cholesterol should begin at age 44 and be repeated every 5 years. If your lipid or cholesterol levels are high, you are over 50, or you are a high risk for heart disease, you may need your cholesterol levels checked more frequently.Ongoing high lipid and cholesterol levels should be treated with medicines, if diet and exercise  are not effective.  If you smoke, find out from your caregiver how to quit. If you do not use tobacco, do not start.  If you choose to drink alcohol, do not exceed 2 drinks per day. One drink is considered to be 12 ounces (355 mL) of beer, 5 ounces (148 mL) of wine, or 1.5 ounces (44 mL) of liquor.  Avoid use of street drugs. Do not share needles with anyone. Ask for help if you need support or instructions about stopping the use of drugs.  High blood pressure causes heart disease and increases the risk of stroke. Blood pressure should be checked at least every 1 to 2 years. Ongoing high blood pressure should be treated with medicines if weight loss and exercise are not effective.  If you are 20 to 42 years old, ask your caregiver if you should take aspirin to prevent heart disease.  Diabetes screening involves taking a blood sample to check your fasting blood sugar level. This should be done once every 3 years, after age 46, if you are within normal weight and without risk factors for diabetes. Testing should be considered at a younger age or be carried out more frequently if you are overweight and have at least 1 risk factor for diabetes.  Colorectal  cancer can be detected and often prevented. Most routine colorectal cancer screening begins at the age of 31 and continues through age 39. However, your caregiver may recommend screening at an earlier age if you have risk factors for colon cancer. On a yearly basis, your caregiver may provide home test kits to check for hidden blood in the stool. Use of a small camera at the end of a tube, to directly examine the colon (sigmoidoscopy or colonoscopy), can detect the earliest forms of colorectal cancer. Talk to your caregiver about this at age 60, when routine screening begins. Direct examination of the colon should be repeated every 5 to 10 years through age 55, unless early forms of pre-cancerous polyps or small growths are found.  Hepatitis C blood  testing is recommended for all people born from [birth year] through 1966 and any individual with known risks for hepatitis C.  Healthy men should no longer receive prostate-specific antigen (PSA) blood tests as part of routine cancer screening. Consult with your caregiver about prostate cancer screening.  Testicular cancer screening is not recommended for adolescents or adult males who have no symptoms. Screening includes self-exam, caregiver exam, and other screening tests. Consult with your caregiver about any symptoms you have or any concerns you have about testicular cancer.  Practice safe sex. Use condoms and avoid high-risk sexual practices to reduce the spread of sexually transmitted infections (STIs).  Use sunscreen with a sun protection factor (SPF) of 30 or greater. Apply sunscreen liberally and repeatedly throughout the day. You should seek shade when your shadow is shorter than you. Protect yourself by wearing long sleeves, pants, a wide-brimmed hat, and sunglasses year round, whenever you are outdoors.  Notify your caregiver of new moles or changes in moles, especially if there is a change in shape or color. Also notify your caregiver if a mole is larger than the size of a pencil eraser.  A one-time screening for abdominal aortic aneurysm (AAA) and surgical repair of large AAAs by sound wave imaging (ultrasonography) is recommended for ages 43 to 82 years who are current or former smokers.  Stay current with your immunizations. Document Released: 04/08/2008 Document Revised: 01/03/2012 Document Reviewed: 03/08/2011 [facility] Patient Information 2013 [place], [place].

## 2012-08-04 ENCOUNTER — Encounter: Payer: Self-pay | Admitting: Internal Medicine

## 2012-08-08 ENCOUNTER — Other Ambulatory Visit: Payer: Self-pay

## 2012-08-08 NOTE — Telephone Encounter (Signed)
Pt called requesting advisement on if MD ordered lab test to determine if he had "cancer process" based on swelling and pain of lymph nodes discussed at OV. Pt was advised that labs all resulted normal - no noted infection. Pt understood that there was no indication that further testing was necessary.

## 2012-08-24 ENCOUNTER — Other Ambulatory Visit: Payer: BC Managed Care – PPO

## 2012-08-24 ENCOUNTER — Ambulatory Visit (INDEPENDENT_AMBULATORY_CARE_PROVIDER_SITE_OTHER): Payer: BC Managed Care – PPO | Admitting: Internal Medicine

## 2012-08-24 ENCOUNTER — Encounter: Payer: Self-pay | Admitting: Internal Medicine

## 2012-08-24 VITALS — BP 116/72 | HR 60 | Temp 97.7°F | Resp 16 | Wt 167.0 lb

## 2012-08-24 DIAGNOSIS — R5383 Other fatigue: Secondary | ICD-10-CM

## 2012-08-24 DIAGNOSIS — R5381 Other malaise: Secondary | ICD-10-CM

## 2012-08-24 NOTE — Patient Instructions (Signed)

## 2012-08-24 NOTE — Progress Notes (Signed)
  Subjective:    Patient ID: John Chen, male    DOB: 07-Feb-1971, 41 y.o.   MRN: [id number]  HPI  He returns c/o fatigue, he tells me that the pain in his lymph nodes has resolved.  Review of Systems  Constitutional: Positive for fatigue. Negative for fever, chills, diaphoresis, activity change, appetite change and unexpected weight change.  HENT: Negative.   Eyes: Negative.   Respiratory: Negative.   Cardiovascular: Negative.   Gastrointestinal: Negative for nausea, vomiting, abdominal pain and diarrhea.  Genitourinary: Negative.   Musculoskeletal: Negative.   Skin: Negative for color change, pallor, rash and wound.  Neurological: Negative.   Hematological: Negative for adenopathy. Does not bruise/bleed easily.  Psychiatric/Behavioral: Negative.        Objective:   Physical Exam  Vitals reviewed. Constitutional: He is oriented to person, place, and time. He appears well-developed and well-nourished. No distress.  HENT:  Head: Normocephalic and atraumatic.  Mouth/Throat: Oropharynx is clear and moist. No oropharyngeal exudate.  Eyes: Conjunctivae normal are normal. Right eye exhibits no discharge. Left eye exhibits no discharge. No scleral icterus.  Neck: Normal range of motion. Neck supple. No JVD present. No tracheal deviation present. No thyromegaly present.  Cardiovascular: Normal rate, regular rhythm, normal heart sounds and intact distal pulses.  Exam reveals no gallop and no friction rub.   No murmur heard. Pulmonary/Chest: Effort normal and breath sounds normal. No stridor. No respiratory distress. He has no wheezes. He has no rales. He exhibits no tenderness.  Abdominal: Soft. Bowel sounds are normal. He exhibits no distension and no mass. There is no tenderness. There is no rebound and no guarding.  Musculoskeletal: Normal range of motion. He exhibits no edema and no tenderness.  Lymphadenopathy:    He has no cervical adenopathy.  Neurological: He is oriented to  person, place, and time.  Skin: Skin is warm and dry. No rash noted. He is not diaphoretic. No erythema. No pallor.  Psychiatric: He has a normal mood and affect. His behavior is normal. Judgment and thought content normal.     Lab Results  Component Value Date   WBC 5.1 08/03/2012   HGB 14.0 08/03/2012   HCT 41.8 08/03/2012   PLT 162.0 08/03/2012   GLUCOSE 77 08/03/2012   CHOL 119 08/03/2012   TRIG 148.0 08/03/2012   HDL 53.50 08/03/2012   LDLCALC 36 08/03/2012   ALT 35 08/03/2012   AST 31 08/03/2012   NA 141 08/03/2012   K 4.1 08/03/2012   CL 105 08/03/2012   CREATININE 1.1 08/03/2012   BUN 14 08/03/2012   CO2 30 08/03/2012   TSH 3.58 08/03/2012   PSA 0.13 08/03/2012       Assessment & Plan:

## 2012-08-24 NOTE — Assessment & Plan Note (Signed)
At his request I will check his T level

## 2012-08-25 ENCOUNTER — Encounter: Payer: Self-pay | Admitting: Internal Medicine

## 2012-08-25 LAB — TESTOSTERONE, FREE, TOTAL, SHBG
Testosterone, Free: 86.3 pg/mL (ref 47.0–244.0)
Testosterone-% Free: 1.7 % (ref 1.6–2.9)

## 2013-02-14 ENCOUNTER — Ambulatory Visit (INDEPENDENT_AMBULATORY_CARE_PROVIDER_SITE_OTHER): Payer: BC Managed Care – PPO | Admitting: Internal Medicine

## 2013-02-14 ENCOUNTER — Encounter: Payer: Self-pay | Admitting: Internal Medicine

## 2013-02-14 ENCOUNTER — Ambulatory Visit (INDEPENDENT_AMBULATORY_CARE_PROVIDER_SITE_OTHER)
Admission: RE | Admit: 2013-02-14 | Discharge: 2013-02-14 | Disposition: A | Discharge status: Home / Self Care | Payer: BC Managed Care – PPO | Source: Ambulatory Visit | Attending: Internal Medicine | Admitting: Internal Medicine

## 2013-02-14 VITALS — BP 122/88 | HR 68 | Temp 98.0°F | Resp 16 | Wt 181.0 lb

## 2013-02-14 DIAGNOSIS — K1370 Unspecified lesions of oral mucosa: Secondary | ICD-10-CM | POA: Insufficient documentation

## 2013-02-14 DIAGNOSIS — M542 Cervicalgia: Secondary | ICD-10-CM

## 2013-02-14 DIAGNOSIS — Z23 Encounter for immunization: Secondary | ICD-10-CM

## 2013-02-14 MED ORDER — IBUPROFEN 600 MG PO TABS: 600.0000 mg | ORAL_TABLET | Freq: Three times a day (TID) | ORAL | Status: DC | PRN

## 2013-02-14 NOTE — Patient Instructions (Signed)
Torticollis, Acute You have suddenly (acutely) developed a twisted neck (torticollis). This is usually a self-limited condition. CAUSES  Acute torticollis may be caused by malposition, trauma or infection. Most commonly, acute torticollis is caused by sleeping in an awkward position. Torticollis may also be caused by the flexion, extension or twisting of the neck muscles beyond their normal position. Sometimes, the exact cause may not be known. SYMPTOMS  Usually, there is pain and limited movement of the neck. Your neck may twist to one side. DIAGNOSIS  The diagnosis is often made by physical examination. X-rays, CT scans or MRIs may be done if there is a history of trauma or concern of infection. TREATMENT  For a common, stiff neck that develops during sleep, treatment is focused on relaxing the contracted neck muscle. Medications (including shots) may be used to treat the problem. Most cases resolve in several days. Torticollis usually responds to conservative physical therapy. If left untreated, the shortened and spastic neck muscle can cause deformities in the face and neck. Rarely, surgery is required. HOME CARE INSTRUCTIONS   Use over-the-counter and prescription medications as directed by your caregiver.  Do stretching exercises and massage the neck as directed by your caregiver.  Follow up with physical therapy if needed and as directed by your caregiver. SEEK IMMEDIATE MEDICAL CARE IF:   You develop difficulty breathing or noisy breathing (stridor).  You drool, develop trouble swallowing or have pain with swallowing.  You develop numbness or weakness in the hands or feet.  You have changes in speech or vision.  You have problems with urination or bowel movements.  You have difficulty walking.  You have a fever.  You have increased pain. MAKE SURE YOU:   Understand these instructions.  Will watch your condition.  Will get help right away if you are not doing well or  get worse. Document Released: 10/08/2000 Document Revised: 01/03/2012 Document Reviewed: 11/19/2009 [employer] Patient Information 2013 [employer].  

## 2013-02-14 NOTE — Assessment & Plan Note (Signed)
I will check a plain film to see if he has DDD, spurring, spinal stenosis, etc. He will try motrin for pain relief

## 2013-02-14 NOTE — Assessment & Plan Note (Signed)
I have referred him to an oral surgeon to have this evaluated

## 2013-02-14 NOTE — Progress Notes (Signed)
Subjective:    Patient ID: John Chen, male    DOB: 1971/02/14, 42 y.o.   MRN: [id number]  Neck Pain  This is a recurrent problem. Episode onset: for 2 months. The problem occurs intermittently. The problem has been unchanged. The pain is associated with nothing. The pain is present in the left side. The quality of the pain is described as aching. The pain is at a severity of 2/10. The pain is mild. Nothing aggravates the symptoms. The pain is same all the time. Associated symptoms include tingling (in his left hand). Pertinent negatives include no chest pain, fever, headaches, leg pain, numbness, pain with swallowing, paresis, photophobia, syncope, trouble swallowing, visual change, weakness or weight loss. He has tried nothing for the symptoms. The treatment provided no relief.      Review of Systems  Constitutional: Negative.  Negative for fever, chills, weight loss, diaphoresis, activity change, appetite change, fatigue and unexpected weight change.  HENT: Positive for mouth sores (recurrent lesion in left upper side of mouth), neck pain and neck stiffness. Negative for sore throat, facial swelling, trouble swallowing and voice change.   Eyes: Negative.  Negative for photophobia.  Respiratory: Negative.   Cardiovascular: Negative.  Negative for chest pain, palpitations, leg swelling and syncope.  Gastrointestinal: Negative.   Endocrine: Negative.   Genitourinary: Negative.   Skin: Negative.   Allergic/Immunologic: Negative.   Neurological: Positive for tingling (in his left hand). Negative for dizziness, tremors, weakness, numbness and headaches.  Hematological: Negative.  Negative for adenopathy. Does not bruise/bleed easily.  Psychiatric/Behavioral: Negative.        Objective:   Physical Exam  Vitals reviewed. Constitutional: He is oriented to person, place, and time. He appears well-developed and well-nourished.  Non-toxic appearance. He does not have a sickly appearance. He  does not appear ill. No distress.  HENT:  Head: Normocephalic and atraumatic. No trismus in the jaw.  Mouth/Throat: Oropharynx is clear and moist and mucous membranes are normal. Mucous membranes are not pale, not dry and not cyanotic. He does not have dentures. Oral lesions present. Abnormal dentition. Dental caries present. No dental abscesses, edematous or lacerations. No oropharyngeal exudate, posterior oropharyngeal edema, posterior oropharyngeal erythema or tonsillar abscesses.    Eyes: Conjunctivae are normal. Right eye exhibits no discharge. Left eye exhibits no discharge. No scleral icterus.  Neck: Normal range of motion and full passive range of motion without pain. Neck supple. No JVD present. No spinous process tenderness and no muscular tenderness present. No rigidity. No tracheal deviation, no edema, no erythema and normal range of motion present. No thyromegaly present.  Cardiovascular: Normal rate, regular rhythm, normal heart sounds and intact distal pulses.  Exam reveals no gallop and no friction rub.   No murmur heard. Pulmonary/Chest: Effort normal and breath sounds normal. No stridor. No respiratory distress. He has no wheezes. He has no rales. He exhibits no tenderness.  Abdominal: Soft. Bowel sounds are normal. He exhibits no distension and no mass. There is no tenderness. There is no rebound and no guarding.  Musculoskeletal: Normal range of motion. He exhibits no edema and no tenderness.       Cervical back: Normal. He exhibits normal range of motion, no tenderness, no bony tenderness, no swelling, no edema, no deformity, no laceration, no pain, no spasm and normal pulse.  Lymphadenopathy:    He has no cervical adenopathy.  Neurological: He is alert and oriented to person, place, and time. He has normal strength. He displays no atrophy,  no tremor and normal reflexes. No cranial nerve deficit or sensory deficit. He exhibits normal muscle tone. He displays a negative Romberg  sign. He displays no seizure activity. Coordination and gait normal. He displays no Babinski's sign on the right side. He displays no Babinski's sign on the left side.  Reflex Scores:      Tricep reflexes are 1+ on the right side and 1+ on the left side.      Bicep reflexes are 1+ on the right side and 1+ on the left side.      Brachioradialis reflexes are 1+ on the right side and 1+ on the left side.      Patellar reflexes are 1+ on the right side and 1+ on the left side.      Achilles reflexes are 1+ on the right side and 1+ on the left side. Skin: Skin is warm and dry. No rash noted. He is not diaphoretic. No erythema. No pallor.  Psychiatric: He has a normal mood and affect. His behavior is normal. Judgment and thought content normal.      Lab Results  Component Value Date   WBC 5.1 08/03/2012   HGB 14.0 08/03/2012   HCT 41.8 08/03/2012   PLT 162.0 08/03/2012   GLUCOSE 77 08/03/2012   CHOL 119 08/03/2012   TRIG 148.0 08/03/2012   HDL 53.50 08/03/2012   LDLCALC 36 08/03/2012   ALT 35 08/03/2012   AST 31 08/03/2012   NA 141 08/03/2012   K 4.1 08/03/2012   CL 105 08/03/2012   CREATININE 1.1 08/03/2012   BUN 14 08/03/2012   CO2 30 08/03/2012   TSH 3.58 08/03/2012   PSA 0.13 08/03/2012      Assessment & Plan:

## 2018-12-29 ENCOUNTER — Ambulatory Visit (INDEPENDENT_AMBULATORY_CARE_PROVIDER_SITE_OTHER): Payer: 59 | Admitting: Family Medicine

## 2018-12-29 ENCOUNTER — Encounter: Payer: Self-pay | Admitting: Family Medicine

## 2018-12-29 VITALS — BP 152/96 | HR 75 | Temp 98.1°F | Ht 69.0 in | Wt 200.0 lb

## 2018-12-29 DIAGNOSIS — M7021 Olecranon bursitis, right elbow: Secondary | ICD-10-CM | POA: Diagnosis not present

## 2018-12-29 DIAGNOSIS — I10 Essential (primary) hypertension: Secondary | ICD-10-CM

## 2018-12-29 MED ORDER — CHLORTHALIDONE 25 MG PO TABS: 25.0000 mg | ORAL_TABLET | Freq: Every day | ORAL | 1 refills | Status: DC

## 2018-12-29 NOTE — Progress Notes (Signed)
New Patient Office Visit  Subjective:  Patient ID: John Chen, male    DOB: December 18, 1970  Age: 48 y.o. MRN: [id number]  CC:  Chief Complaint  Patient presents with  . New Patient (Initial Visit)    Patient is here toda to establish care. He is not currently fasting.  He declines the flu shot. He thinks his last TD was in the ED  around 6-7 years ago. He would like to discuss his HTN.  He used to take Losartan but it caused vivid dreams and feel bad.  He would like to discuss an alternative.  He also states that he has intermittent bilateral elbow and hand pain with some edema.    HPI John Chen presents for establishment of care and follow-up of his blood pressure and right elbow pain.  Longstanding history of hypertension that has not been treated recently due to lack of insurance coverage.  He had taken losartan in the past but the medication did not agree with him.  Family history of hypertension.  Patient does not smoke or use illicit drugs but he is using creatine for weight building.  He does drink a protein shake in the morning and at night before bedtime.  He is an avid weightlifter and right-hand dominant.  He works as an Clinical biochemist.  He does average a glass of wine daily.  No specific injury to his elbow he just aches especially when he is doing triceps extensions.  Past Medical History:  Diagnosis Date  . Hypertension     History reviewed. No pertinent surgical history.  Family History  Problem Relation Age of Onset  . Hypertension Mother   . Hypertension Father   . Asthma Sister   . Hypertension Sister   . Drug abuse Brother   . Early death Brother   . Heart attack Brother   . Hypertension Maternal Grandmother   . Asthma Sister   . Alcohol abuse Neg Hx   . Cancer Neg Hx   . Diabetes Neg Hx   . Hearing loss Neg Hx   . Heart disease Neg Hx   . Hyperlipidemia Neg Hx   . Kidney disease Neg Hx   . Stroke Neg Hx     Social History   Socioeconomic History  .  Marital status: Married    Spouse name: Not on file  . Number of children: Not on file  . Years of education: Not on file  . Highest education level: Not on file  Occupational History  . Not on file  Social Needs  . Financial resource strain: Not on file  . Food insecurity:    Worry: Not on file    Inability: Not on file  . Transportation needs:    Medical: Not on file    Non-medical: Not on file  Tobacco Use  . Smoking status: Never Smoker  . Smokeless tobacco: Never Used  Substance and Sexual Activity  . Alcohol use: Yes    Alcohol/week: 5.0 standard drinks    Types: 5 Glasses of wine per week  . Drug use: No  . Sexual activity: Yes  Lifestyle  . Physical activity:    Days per week: Not on file    Minutes per session: Not on file  . Stress: Not on file  Relationships  . Social connections:    Talks on phone: Not on file    Gets together: Not on file    Attends religious service: Not on file  Active member of club or organization: Not on file    Attends meetings of clubs or organizations: Not on file    Relationship status: Not on file  . Intimate partner violence:    Fear of current or ex partner: Not on file    Emotionally abused: Not on file    Physically abused: Not on file    Forced sexual activity: Not on file  Other Topics Concern  . Not on file  Social History Narrative  . Not on file    ROS Review of Systems  Constitutional: Negative for diaphoresis, fatigue, fever and unexpected weight change.  HENT: Negative.   Eyes: Negative for photophobia and visual disturbance.  Respiratory: Negative.   Cardiovascular: Negative.   Gastrointestinal: Negative.   Endocrine: Negative for polyphagia and polyuria.  Genitourinary: Negative.   Musculoskeletal: Positive for arthralgias.  Skin: Negative for pallor and rash.  Allergic/Immunologic: Negative for immunocompromised state.  Neurological: Negative for light-headedness and headaches.  Hematological: Does  not bruise/bleed easily.  Psychiatric/Behavioral: Negative.     Objective:   Today's Vitals: BP (!) 152/96 (BP Location: Left Arm, Patient Position: Sitting, Cuff Size: Large)   Pulse 75   Temp 98.1 F (36.7 C) (Oral)   Ht 5\' 9"  (1.753 m)   Wt 200 lb (90.7 kg)   SpO2 96%   BMI 29.53 kg/m   Physical Exam Vitals signs and nursing note reviewed.  Constitutional:      General: He is not in acute distress.    Appearance: Normal appearance. He is normal weight. He is not toxic-appearing or diaphoretic.  HENT:     Head: Normocephalic and atraumatic.     Right Ear: Tympanic membrane and ear canal normal.     Left Ear: Tympanic membrane and ear canal normal.     Nose: No congestion or rhinorrhea.     Mouth/Throat:     Mouth: Mucous membranes are moist.     Pharynx: No oropharyngeal exudate.  Eyes:     General: No scleral icterus.       Right eye: No discharge.     Extraocular Movements: Extraocular movements intact.     Conjunctiva/sclera: Conjunctivae normal.     Pupils: Pupils are equal, round, and reactive to light.  Neck:     Musculoskeletal: No neck rigidity or muscular tenderness.  Cardiovascular:     Rate and Rhythm: Normal rate and regular rhythm.     Heart sounds: Normal heart sounds.  Pulmonary:     Effort: Pulmonary effort is normal.     Breath sounds: Normal breath sounds.  Abdominal:     Palpations: Abdomen is soft.  Musculoskeletal:     Right elbow: He exhibits normal range of motion, no swelling, no effusion and no deformity. Tenderness found. Olecranon process tenderness noted.  Lymphadenopathy:     Cervical: No cervical adenopathy.  Skin:    General: Skin is warm and dry.  Neurological:     Mental Status: He is alert and oriented to person, place, and time. Mental status is at baseline.  Psychiatric:        Mood and Affect: Mood normal.        Behavior: Behavior normal.     Assessment & Plan:   Problem List Items Addressed This Visit       Cardiovascular and Mediastinum   Essential hypertension - Primary   Relevant Medications   chlorthalidone (HYGROTON) 25 MG tablet     Musculoskeletal and Integument   Olecranon  bursitis of right elbow   Relevant Orders   Ambulatory referral to Sports Medicine      Outpatient Encounter Medications as of 12/29/2018  Medication Sig  . chlorthalidone (HYGROTON) 25 MG tablet Take 1 tablet (25 mg total) by mouth daily.  . [DISCONTINUED] ibuprofen (ADVIL,MOTRIN) 600 MG tablet Take 1 tablet (600 mg total) by mouth every 8 (eight) hours as needed for pain.  . [DISCONTINUED] Multiple Vitamin (MULITIVITAMIN WITH MINERALS) TABS Take 1 tablet by mouth daily.   No facility-administered encounter medications on file as of 12/29/2018.     Follow-up: No follow-ups on file.   Patient will start chlorthalidone.  He was given information on hypertension and the DASH diet.  He was also given information on olecranon bursitis.  Agrees to go for sports medicine follow-up of his olecranon bursitis.  Follow-up in 1 month fasting for complete physical and follow-up for his blood pressure  Libby Maw, MD

## 2018-12-29 NOTE — Patient Instructions (Addendum)
Elbow Bursitis  Bursitis is swelling and pain at the tip of the elbow. This happens when fluid builds up in a sac under the skin (bursa). This may also be called olecranon bursitis. What are the causes? Elbow bursitis may be caused by:  Elbow injury, such as falling onto the elbow.  Leaning on hard surfaces for long periods of time.  Infection from an injury that breaks the skin near the elbow.  A bone growth (spur) that forms at the tip of the elbow.  A medical condition that causes inflammation, such as gout or rheumatoid arthritis. Sometimes the cause is not known. What are the signs or symptoms? The first sign of elbow bursitis is usually swelling at the tip of the elbow. This can grow to be about the size of a golf ball. Swelling may start suddenly or develop gradually. Other symptoms may include:  Pain when bending or leaning on the elbow.  Not being able to move the elbow normally. If bursitis is caused by an infection, you may have:  Redness, warmth, and tenderness of the elbow.  Drainage of pus from the swollen area over the elbow, if the skin breaks open. How is this diagnosed? This condition may be diagnosed based on:  Your symptoms and medical history.  Any recent injuries you have had.  A physical exam.  X-rays to check for a bone spur or fracture.  Draining fluid from the bursa to test it for infection.  Blood tests to rule out gout or rheumatoid arthritis. How is this treated? Treatment for elbow bursitis depends on the cause. Treatment may include:  Medicines. These may include: ? Over-the-counter medicines to relieve pain and inflammation. ? Antibiotic medicines. ? Injections of anti-inflammatory medicines (steroids).  Draining fluid from the bursa.  Wrapping your elbow with a bandage.  Wearing elbow pads. If these treatments do not help, you may need surgery to remove the bursa. Follow these instructions at home: Medicines  Take  over-the-counter and prescription medicines only as told by your health care provider.  If you were prescribed an antibiotic medicine, take it as told by your health care provider. Do not stop taking the antibiotic even if you start to feel better. Managing pain, stiffness, and swelling   If directed, put ice on your elbow: ? Put ice in a plastic bag. ? Place a towel between your skin and the bag. ? Leave the ice on for 20 minutes, 2-3 times a day.  If your bursitis is caused by an injury, rest your elbow and wear your bandage as told by your health care provider.  Use elbow pads or elbow wraps to cushion your elbow as needed. General instructions  Avoid any activities that cause elbow pain. Ask your health care provider what activities are safe for you.  Keep all follow-up visits as told by your health care provider. This is important. Contact a health care provider if you have:  A fever.  Symptoms that do not get better with treatment.  Pain or swelling that: ? Gets worse. ? Goes away and then comes back.  Pus draining from your elbow. Get help right away if you have:  Trouble moving your arm, hand, or fingers. Summary  Elbow bursitis is inflammation of the fluid-filled sac (bursa) between the tip of your elbow bone (olecranon) and your skin.  Treatment for elbow bursitis depends on the cause. It may include medicines to relieve pain and inflammation, antibiotic medicines, and draining fluid from your elbow.    Contact a health care provider if your symptoms do not get better with treatment, or if your symptoms go away and then come back. This information is not intended to replace advice given to you by your health care provider. Make sure you discuss any questions you have with your health care provider. Document Released: 11/10/2006 Document Revised: 09/20/2017 Document Reviewed: 09/20/2017 Elsevier Interactive Patient Education  2019 [employer] Eating  Plan DASH stands for "Dietary Approaches to Stop Hypertension." The DASH eating plan is a healthy eating plan that has been shown to reduce high blood pressure (hypertension). It may also reduce your risk for type 2 diabetes, heart disease, and stroke. The DASH eating plan may also help with weight loss. What are tips for following this plan?  General guidelines  Avoid eating more than 2,300 mg (milligrams) of salt (sodium) a day. If you have hypertension, you may need to reduce your sodium intake to 1,500 mg a day.  Limit alcohol intake to no more than 1 drink a day for nonpregnant women and 2 drinks a day for men. One drink equals 12 oz of beer, 5 oz of wine, or 1 oz of hard liquor.  Work with your health care provider to maintain a healthy body weight or to lose weight. Ask what an ideal weight is for you.  Get at least 30 minutes of exercise that causes your heart to beat faster (aerobic exercise) most days of the week. Activities may include walking, swimming, or biking.  Work with your health care provider or diet and nutrition specialist (dietitian) to adjust your eating plan to your individual calorie needs. Reading food labels   Check food labels for the amount of sodium per serving. Choose foods with less than 5 percent of the Daily Value of sodium. Generally, foods with less than 300 mg of sodium per serving fit into this eating plan.  To find whole grains, look for the word "whole" as the first word in the ingredient list. Shopping  Buy products labeled as "low-sodium" or "no salt added."  Buy fresh foods. Avoid canned foods and premade or frozen meals. Cooking  Avoid adding salt when cooking. Use salt-free seasonings or herbs instead of table salt or sea salt. Check with your health care provider or pharmacist before using salt substitutes.  Do not fry foods. Cook foods using healthy methods such as baking, boiling, grilling, and broiling instead.  Cook with  heart-healthy oils, such as olive, canola, soybean, or sunflower oil. Meal planning  Eat a balanced diet that includes: ? 5 or more servings of fruits and vegetables each day. At each meal, try to fill half of your plate with fruits and vegetables. ? Up to 6-8 servings of whole grains each day. ? Less than 6 oz of lean meat, poultry, or fish each day. A 3-oz serving of meat is about the same size as a deck of cards. One egg equals 1 oz. ? 2 servings of low-fat dairy each day. ? A serving of nuts, seeds, or beans 5 times each week. ? Heart-healthy fats. Healthy fats called Omega-3 fatty acids are found in foods such as flaxseeds and coldwater fish, like sardines, salmon, and mackerel.  Limit how much you eat of the following: ? Canned or prepackaged foods. ? Food that is high in trans fat, such as fried foods. ? Food that is high in saturated fat, such as fatty meat. ? Sweets, desserts, sugary drinks, and other foods with added sugar. ?  Full-fat dairy products.  Do not salt foods before eating.  Try to eat at least 2 vegetarian meals each week.  Eat more home-cooked food and less restaurant, buffet, and fast food.  When eating at a restaurant, ask that your food be prepared with less salt or no salt, if possible. What foods are recommended? The items listed may not be a complete list. Talk with your dietitian about what dietary choices are best for you. Grains Whole-grain or whole-wheat bread. Whole-grain or whole-wheat pasta. Brown rice. [place]. Bulgur. Whole-grain and low-sodium cereals. Pita bread. Low-fat, low-sodium crackers. Whole-wheat flour tortillas. Vegetables Fresh or frozen vegetables (raw, steamed, roasted, or grilled). Low-sodium or reduced-sodium tomato and vegetable juice. Low-sodium or reduced-sodium tomato sauce and tomato paste. Low-sodium or reduced-sodium canned vegetables. Fruits All fresh, dried, or frozen fruit. Canned fruit in natural juice (without  added sugar). Meat and other protein foods Skinless chicken or [place]. Ground chicken or [place]. Pork with fat trimmed off. Fish and seafood. Egg whites. Dried beans, peas, or lentils. Unsalted nuts, nut butters, and seeds. Unsalted canned beans. Lean cuts of beef with fat trimmed off. Low-sodium, lean deli meat. Dairy Low-fat (1%) or fat-free (skim) milk. Fat-free, low-fat, or reduced-fat cheeses. Nonfat, low-sodium ricotta or cottage cheese. Low-fat or nonfat yogurt. Low-fat, low-sodium cheese. Fats and oils Soft margarine without trans fats. Vegetable oil. Low-fat, reduced-fat, or light mayonnaise and salad dressings (reduced-sodium). Canola, safflower, olive, soybean, and sunflower oils. Avocado. Seasoning and other foods Herbs. Spices. Seasoning mixes without salt. Unsalted popcorn and pretzels. Fat-free sweets. What foods are not recommended? The items listed may not be a complete list. Talk with your dietitian about what dietary choices are best for you. Grains Baked goods made with fat, such as croissants, muffins, or some breads. Dry pasta or rice meal packs. Vegetables Creamed or fried vegetables. Vegetables in a cheese sauce. Regular canned vegetables (not low-sodium or reduced-sodium). Regular canned tomato sauce and paste (not low-sodium or reduced-sodium). Regular tomato and vegetable juice (not low-sodium or reduced-sodium). Angie Fava. Olives. Fruits Canned fruit in a light or heavy syrup. Fried fruit. Fruit in cream or butter sauce. Meat and other protein foods Fatty cuts of meat. Ribs. Fried meat. Berniece Salines. Sausage. Bologna and other processed lunch meats. Salami. Fatback. Hotdogs. Bratwurst. Salted nuts and seeds. Canned beans with added salt. Canned or smoked fish. Whole eggs or egg yolks. Chicken or [place] with skin. Dairy Whole or 2% milk, cream, and half-and-half. Whole or full-fat cream cheese. Whole-fat or sweetened yogurt. Full-fat cheese. Nondairy creamers. Whipped toppings.  Processed cheese and cheese spreads. Fats and oils Butter. Stick margarine. Lard. Shortening. Ghee. Bacon fat. Tropical oils, such as coconut, palm kernel, or palm oil. Seasoning and other foods Salted popcorn and pretzels. Onion salt, garlic salt, seasoned salt, table salt, and sea salt. Worcestershire sauce. Tartar sauce. Barbecue sauce. Teriyaki sauce. Soy sauce, including reduced-sodium. Steak sauce. Canned and packaged gravies. Fish sauce. Oyster sauce. Cocktail sauce. Horseradish that you find on the shelf. Ketchup. Mustard. Meat flavorings and tenderizers. Bouillon cubes. Hot sauce and Tabasco sauce. Premade or packaged marinades. Premade or packaged taco seasonings. Relishes. Regular salad dressings. Where to find more information:  National Heart, Lung, and [place]: https://wilson-eaton.com/  American Heart Association: www.heart.org Summary  The DASH eating plan is a healthy eating plan that has been shown to reduce high blood pressure (hypertension). It may also reduce your risk for type 2 diabetes, heart disease, and stroke.  With the DASH eating plan, you  should limit salt (sodium) intake to 2,300 mg a day. If you have hypertension, you may need to reduce your sodium intake to 1,500 mg a day.  When on the DASH eating plan, aim to eat more fresh fruits and vegetables, whole grains, lean proteins, low-fat dairy, and heart-healthy fats.  Work with your health care provider or diet and nutrition specialist (dietitian) to adjust your eating plan to your individual calorie needs. This information is not intended to replace advice given to you by your health care provider. Make sure you discuss any questions you have with your health care provider. Document Released: 09/30/2011 Document Revised: 10/04/2016 Document Reviewed: 10/04/2016 Elsevier Interactive Patient Education  2019 [employer].  Hypertension Hypertension, commonly called high blood pressure, is when the force of blood  pumping through the arteries is too strong. The arteries are the blood vessels that carry blood from the heart throughout the body. Hypertension forces the heart to work harder to pump blood and may cause arteries to become narrow or stiff. Having untreated or uncontrolled hypertension can cause heart attacks, strokes, kidney disease, and other problems. A blood pressure reading consists of a higher number over a lower number. Ideally, your blood pressure should be below 120/80. The first ("top") number is called the systolic pressure. It is a measure of the pressure in your arteries as your heart beats. The second ("bottom") number is called the diastolic pressure. It is a measure of the pressure in your arteries as the heart relaxes. What are the causes? The cause of this condition is not known. What increases the risk? Some risk factors for high blood pressure are under your control. Others are not. Factors you can change  Smoking.  Having type 2 diabetes mellitus, high cholesterol, or both.  Not getting enough exercise or physical activity.  Being overweight.  Having too much fat, sugar, calories, or salt (sodium) in your diet.  Drinking too much alcohol. Factors that are difficult or impossible to change  Having chronic kidney disease.  Having a family history of high blood pressure.  Age. Risk increases with age.  Race. You may be at higher risk if you are African-American.  Gender. Men are at higher risk than women before age 4. After age 5, women are at higher risk than men.  Having obstructive sleep apnea.  Stress. What are the signs or symptoms? Extremely high blood pressure (hypertensive crisis) may cause:  Headache.  Anxiety.  Shortness of breath.  Nosebleed.  Nausea and vomiting.  Severe chest pain.  Jerky movements you cannot control (seizures). How is this diagnosed? This condition is diagnosed by measuring your blood pressure while you are seated,  with your arm resting on a surface. The cuff of the blood pressure monitor will be placed directly against the skin of your upper arm at the level of your heart. It should be measured at least twice using the same arm. Certain conditions can cause a difference in blood pressure between your right and left arms. Certain factors can cause blood pressure readings to be lower or higher than normal (elevated) for a short period of time:  When your blood pressure is higher when you are in a health care provider's office than when you are at home, this is called white coat hypertension. Most people with this condition do not need medicines.  When your blood pressure is higher at home than when you are in a health care provider's office, this is called masked hypertension. Most people  with this condition may need medicines to control blood pressure. If you have a high blood pressure reading during one visit or you have normal blood pressure with other risk factors:  You may be asked to return on a different day to have your blood pressure checked again.  You may be asked to monitor your blood pressure at home for 1 week or longer. If you are diagnosed with hypertension, you may have other blood or imaging tests to help your health care provider understand your overall risk for other conditions. How is this treated? This condition is treated by making healthy lifestyle changes, such as eating healthy foods, exercising more, and reducing your alcohol intake. Your health care provider may prescribe medicine if lifestyle changes are not enough to get your blood pressure under control, and if:  Your systolic blood pressure is above 130.  Your diastolic blood pressure is above 80. Your personal target blood pressure may vary depending on your medical conditions, your age, and other factors. Follow these instructions at home: Eating and drinking   Eat a diet that is high in fiber and potassium, and low in  sodium, added sugar, and fat. An example eating plan is called the DASH (Dietary Approaches to Stop Hypertension) diet. To eat this way: ? Eat plenty of fresh fruits and vegetables. Try to fill half of your plate at each meal with fruits and vegetables. ? Eat whole grains, such as whole wheat pasta, brown rice, or whole grain bread. Fill about one quarter of your plate with whole grains. ? Eat or drink low-fat dairy products, such as skim milk or low-fat yogurt. ? Avoid fatty cuts of meat, processed or cured meats, and poultry with skin. Fill about one quarter of your plate with lean proteins, such as fish, chicken without skin, beans, eggs, and tofu. ? Avoid premade and processed foods. These tend to be higher in sodium, added sugar, and fat.  Reduce your daily sodium intake. Most people with hypertension should eat less than 1,500 mg of sodium a day.  Limit alcohol intake to no more than 1 drink a day for nonpregnant women and 2 drinks a day for men. One drink equals 12 oz of beer, 5 oz of wine, or 1 oz of hard liquor. Lifestyle   Work with your health care provider to maintain a healthy body weight or to lose weight. Ask what an ideal weight is for you.  Get at least 30 minutes of exercise that causes your heart to beat faster (aerobic exercise) most days of the week. Activities may include walking, swimming, or biking.  Include exercise to strengthen your muscles (resistance exercise), such as pilates or lifting weights, as part of your weekly exercise routine. Try to do these types of exercises for 30 minutes at least 3 days a week.  Do not use any products that contain nicotine or tobacco, such as cigarettes and e-cigarettes. If you need help quitting, ask your health care provider.  Monitor your blood pressure at home as told by your health care provider.  Keep all follow-up visits as told by your health care provider. This is important. Medicines  Take over-the-counter and  prescription medicines only as told by your health care provider. Follow directions carefully. Blood pressure medicines must be taken as prescribed.  Do not skip doses of blood pressure medicine. Doing this puts you at risk for problems and can make the medicine less effective.  Ask your health care provider about side effects  or reactions to medicines that you should watch for. Contact a health care provider if:  You think you are having a reaction to a medicine you are taking.  You have headaches that keep coming back (recurring).  You feel dizzy.  You have swelling in your ankles.  You have trouble with your vision. Get help right away if:  You develop a severe headache or confusion.  You have unusual weakness or numbness.  You feel faint.  You have severe pain in your chest or abdomen.  You vomit repeatedly.  You have trouble breathing. Summary  Hypertension is when the force of blood pumping through your arteries is too strong. If this condition is not controlled, it may put you at risk for serious complications.  Your personal target blood pressure may vary depending on your medical conditions, your age, and other factors. For most people, a normal blood pressure is less than 120/80.  Hypertension is treated with lifestyle changes, medicines, or a combination of both. Lifestyle changes include weight loss, eating a healthy, low-sodium diet, exercising more, and limiting alcohol. This information is not intended to replace advice given to you by your health care provider. Make sure you discuss any questions you have with your health care provider. Document Released: 10/11/2005 Document Revised: 09/08/2016 Document Reviewed: 09/08/2016 Elsevier Interactive Patient Education  2019 [employer] Chlorthalidone tablets What is this medicine? CHLORTHALIDONE (klor THAL i done) is a diuretic. It increases the amount of urine passed, which causes the body to lose salt and  water. This medicine is used to treat high blood pressure and edema or water retention. This medicine may be used for other purposes; ask your health care provider or pharmacist if you have questions. COMMON BRAND NAME(S): Thalitone What should I tell my health care provider before I take this medicine? They need to know if you have any of these conditions: -asthma -diabetes -gout -kidney disease -liver disease -parathyroid disease -systemic lupus erythematosus (SLE) -taking cortisone, digoxin, lithium carbonate, or drugs for diabetes -an unusual or allergic reaction to chlorthalidone, sulfa drugs, other medicines, foods, dyes, or preservatives -pregnant or trying to get pregnant -breast-feeding How should I use this medicine? Take this medicine by mouth with a glass of water. Follow the directions on the prescription label. It is best to take your dose in the morning with food. Take your medicine at regular intervals. Do not take your medicine more often than directed. Do not stop taking except on your doctor's advice. Talk to your pediatrician regarding the use of this medicine in children. Special care may be needed. Overdosage: If you think you have taken too much of this medicine contact a poison control center or emergency room at once. NOTE: This medicine is only for you. Do not share this medicine with others. What if I miss a dose? If you miss a dose, take it as soon as you can. If it is almost time for your next dose, take only that dose. Do not take double or extra doses. What may interact with this medicine? -barbiturate medicines for sleep or seizure control -digoxin -lithium -medicines for diabetes -norepinephrine -other medicines for high blood pressure -some pain medicines -steroid hormones like prednisone, cortisone, hydrocortisone, corticotropin -tubocurarine This list may not describe all possible interactions. Give your health care provider a list of all the  medicines, herbs, non-prescription drugs, or dietary supplements you use. Also tell them if you smoke, drink alcohol, or use illegal drugs. Some items may interact with  your medicine. What should I watch for while using this medicine? Visit your doctor or health care professional for regular check ups. Check your blood pressure as directed. Ask your doctor or health care professional what your blood pressure should be and when you should contact him or her. You may need to be on a special diet while taking this medicine. Ask your doctor. You may get drowsy or dizzy. Do not drive, use machinery, or do anything that needs mental alertness until you know how this medicine affects you. Do not stand or sit up quickly, especially if you are an older patient. This reduces the risk of dizzy or fainting spells. Alcohol may interfere with the effect of this medicine. Avoid alcoholic drinks. This medicine may affect your blood sugar level. If you have diabetes, check with your doctor or health care professional before changing the dose of your diabetic medicine. This medicine can make you more sensitive to the sun. Keep out of the sun. If you cannot avoid being in the sun, wear protective clothing and use sunscreen. Do not use sun lamps or tanning beds/booths. What side effects may I notice from receiving this medicine? Side effects that you should report to your doctor or health care professional as soon as possible: -allergic reactions like skin rash, itching or hives, swelling of the face, lips, or tongue -dark urine -dry mouth -excess thirst -fast, irregular heart rate -fever, chills -muscle pain, cramps, or spasm -nausea, vomiting -redness, blistering, peeling or loosening of the skin, including inside the mouth -tingling, pain or numbness in the hands or feet -unusually weak or tired -yellowing of the eyes or skin Side effects that usually do not require medical attention (report to your doctor or  health care professional if they continue or are bothersome): -diarrhea or constipation -headache -impotence -loss of appetite -stomach upset This list may not describe all possible side effects. Call your doctor for medical advice about side effects. You may report side effects to FDA at 1-800-FDA-1088. Where should I keep my medicine? Keep out of the reach of children. Store at room temperature between 15 and 30 degrees C (59 and 86 degrees F). Keep container tightly closed. Throw away any unused medicine after the expiration date. NOTE: This sheet is a summary. It may not cover all possible information. If you have questions about this medicine, talk to your doctor, pharmacist, or health care provider.  2019 Elsevier/[employer] Standard (2008-01-16 15:28:48)

## 2019-01-29 ENCOUNTER — Encounter: Payer: 59 | Admitting: Family Medicine

## 2019-02-06 ENCOUNTER — Ambulatory Visit: Payer: 59 | Admitting: Family Medicine

## 2019-02-15 ENCOUNTER — Ambulatory Visit: Payer: 59 | Admitting: Family Medicine

## 2019-02-27 ENCOUNTER — Other Ambulatory Visit: Payer: Self-pay | Admitting: Family Medicine

## 2019-02-27 DIAGNOSIS — I10 Essential (primary) hypertension: Secondary | ICD-10-CM

## 2019-03-15 ENCOUNTER — Ambulatory Visit: Payer: 59 | Admitting: Family Medicine

## 2019-03-30 ENCOUNTER — Encounter: Payer: 59 | Admitting: Family Medicine

## 2019-04-02 ENCOUNTER — Other Ambulatory Visit: Payer: Self-pay | Admitting: Family Medicine

## 2019-04-02 DIAGNOSIS — I10 Essential (primary) hypertension: Secondary | ICD-10-CM

## 2019-04-03 ENCOUNTER — Encounter: Payer: 59 | Admitting: Family Medicine

## 2019-04-10 ENCOUNTER — Ambulatory Visit (INDEPENDENT_AMBULATORY_CARE_PROVIDER_SITE_OTHER): Payer: 59 | Admitting: Family Medicine

## 2019-04-10 ENCOUNTER — Encounter: Payer: Self-pay | Admitting: Family Medicine

## 2019-04-10 VITALS — BP 130/80 | HR 62 | Temp 97.6°F | Ht 69.0 in | Wt 194.4 lb

## 2019-04-10 DIAGNOSIS — R0602 Shortness of breath: Secondary | ICD-10-CM | POA: Insufficient documentation

## 2019-04-10 DIAGNOSIS — Z0000 Encounter for general adult medical examination without abnormal findings: Secondary | ICD-10-CM

## 2019-04-10 DIAGNOSIS — I10 Essential (primary) hypertension: Secondary | ICD-10-CM

## 2019-04-10 DIAGNOSIS — Z125 Encounter for screening for malignant neoplasm of prostate: Secondary | ICD-10-CM

## 2019-04-10 LAB — URINALYSIS, ROUTINE W REFLEX MICROSCOPIC
Bilirubin Urine: NEGATIVE
Hgb urine dipstick: NEGATIVE
Ketones, ur: NEGATIVE
Leukocytes,Ua: NEGATIVE
Nitrite: NEGATIVE
RBC / HPF: NONE SEEN (ref 0–?)
Specific Gravity, Urine: 1.015 (ref 1.000–1.030)
Total Protein, Urine: NEGATIVE
Urine Glucose: NEGATIVE
Urobilinogen, UA: 1 (ref 0.0–1.0)
WBC, UA: NONE SEEN (ref 0–?)
pH: 7 (ref 5.0–8.0)

## 2019-04-10 LAB — COMPREHENSIVE METABOLIC PANEL
ALT: 39 U/L (ref 0–53)
AST: 31 U/L (ref 0–37)
Albumin: 4.5 g/dL (ref 3.5–5.2)
Alkaline Phosphatase: 45 U/L (ref 39–117)
BUN: 27 mg/dL — ABNORMAL HIGH (ref 6–23)
CO2: 29 mEq/L (ref 19–32)
Calcium: 9.7 mg/dL (ref 8.4–10.5)
Chloride: 100 mEq/L (ref 96–112)
Creatinine, Ser: 1.08 mg/dL (ref 0.40–1.50)
GFR: 87.77 mL/min (ref >60.00)
Glucose, Bld: 113 mg/dL — ABNORMAL HIGH (ref 70–99)
Potassium: 4.1 mEq/L (ref 3.5–5.1)
Sodium: 136 mEq/L (ref 135–145)
Total Bilirubin: 0.7 mg/dL (ref 0.2–1.2)
Total Protein: 7.1 g/dL (ref 6.0–8.3)

## 2019-04-10 LAB — CBC
HCT: 42.5 % (ref 39.0–52.0)
Hemoglobin: 14.2 g/dL (ref 13.0–17.0)
MCHC: 33.3 g/dL (ref 30.0–36.0)
MCV: 91 fl (ref 78.0–100.0)
Platelets: 202 10*3/uL (ref 150.0–400.0)
RBC: 4.67 Mil/uL (ref 4.22–5.81)
RDW: 13.3 % (ref 11.5–15.5)
WBC: 4.6 10*3/uL (ref 4.0–10.5)

## 2019-04-10 LAB — PSA: PSA: 0.21 ng/mL (ref 0.10–4.00)

## 2019-04-10 LAB — LIPID PANEL
Cholesterol: 174 mg/dL (ref 0–200)
HDL: 46.4 mg/dL (ref >39.00)
NonHDL: 127.31
Total CHOL/HDL Ratio: 4
Triglycerides: 228 mg/dL — ABNORMAL HIGH (ref 0.0–149.0)
VLDL: 45.6 mg/dL — ABNORMAL HIGH (ref 0.0–40.0)

## 2019-04-10 LAB — TSH: TSH: 1.81 u[IU]/mL (ref 0.35–4.50)

## 2019-04-10 LAB — LDL CHOLESTEROL, DIRECT: Direct LDL: 84 mg/dL

## 2019-04-10 MED ORDER — CHLORTHALIDONE 25 MG PO TABS: 25.0000 mg | ORAL_TABLET | Freq: Every day | ORAL | 0 refills | Status: DC

## 2019-04-10 NOTE — Progress Notes (Signed)
Established Patient Office Visit  Subjective:  Patient ID: John Chen, male    DOB: 1971-10-20  Age: 48 y.o. MRN: [id number]  CC:  Chief Complaint  Patient presents with  . Annual Exam    HPI John Chen presents for for a complete physical and follow-up of his hypertension.  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  Things are well at home continues to live with his wife.  Blood pressure is been controlled with the chlorthalidone and he is having no issues taking it.  Forgot to take it today.  He is experience some shortness of breath at random times.  It is never been excessive associated with exertion, chest pain nausea or diaphoresis.  Patient has no asthma history.  He does not smoke.  He has a favorable lipid profile.  He does experience occasional GERD.  Nocturia x1 his urine flow is excellent.  Continues to move bowels normally without blood in the stool.  Past Medical History:  Diagnosis Date  . Hypertension     History reviewed. No pertinent surgical history.  Family History  Problem Relation Age of Onset  . Hypertension Mother   . Hypertension Father   . Asthma Sister   . Hypertension Sister   . Drug abuse Brother   . Early death Brother   . Heart attack Brother   . Hypertension Maternal Grandmother   . Asthma Sister   . Alcohol abuse Neg Hx   . Cancer Neg Hx   . Diabetes Neg Hx   . Hearing loss Neg Hx   . Heart disease Neg Hx   . Hyperlipidemia Neg Hx   . Kidney disease Neg Hx   . Stroke Neg Hx     Social History   Socioeconomic History  . Marital status: Married    Spouse name: Not on file  . Number of children: Not on file  . Years of education: Not on file  . Highest education level: Not on file  Occupational History  . Not on file  Social Needs  . Financial resource strain: Not on file  . Food insecurity    Worry: Not on file    Inability: Not on file  . Transportation needs    Medical: Not on file   Non-medical: Not on file  Tobacco Use  . Smoking status: Never Smoker  . Smokeless tobacco: Never Used  Substance and Sexual Activity  . Alcohol use: Yes    Alcohol/week: 5.0 standard drinks    Types: 5 Glasses of wine per week  . Drug use: No  . Sexual activity: Yes  Lifestyle  . Physical activity    Days per week: Not on file    Minutes per session: Not on file  . Stress: Not on file  Relationships  . Social Herbalist on phone: Not on file    Gets together: Not on file    Attends religious service: Not on file    Active member of club or organization: Not on file    Attends meetings of clubs or organizations: Not on file    Relationship status: Not on file  . Intimate partner violence    Fear of current or ex partner: Not on file    Emotionally abused: Not on file    Physically abused: Not on file    Forced sexual activity: Not on file  Other Topics Concern  . Not on file  Social History  Narrative  . Not on file    Outpatient Medications Prior to Visit  Medication Sig Dispense Refill  . chlorthalidone (HYGROTON) 25 MG tablet Take 1 tablet by mouth once daily 30 tablet 0   No facility-administered medications prior to visit.     No Known Allergies  ROS Review of Systems  Constitutional: Negative for chills, diaphoresis, fatigue, fever and unexpected weight change.  HENT: Negative.   Eyes: Negative for photophobia and visual disturbance.  Respiratory: Positive for shortness of breath. Negative for chest tightness and wheezing.   Cardiovascular: Negative for chest pain, palpitations and leg swelling.  Gastrointestinal: Negative.   Endocrine: Negative for polyphagia and polyuria.  Genitourinary: Negative.   Musculoskeletal: Negative for joint swelling and myalgias.  Allergic/Immunologic: Negative for immunocompromised state.  Neurological: Negative for light-headedness and headaches.  Hematological: Does not bruise/bleed easily.   Psychiatric/Behavioral: Negative.       Objective:    Physical Exam  Constitutional: He is oriented to person, place, and time. He appears well-developed and well-nourished. No distress.  HENT:  Head: Normocephalic and atraumatic.  Right Ear: External ear normal.  Left Ear: External ear normal.  Mouth/Throat: Oropharynx is clear and moist. No oropharyngeal exudate.  Eyes: Pupils are equal, round, and reactive to light. Conjunctivae are normal. Right eye exhibits no discharge. Left eye exhibits no discharge. No scleral icterus.  Neck: Neck supple. No JVD present. No tracheal deviation present. No thyromegaly present.  Cardiovascular: Normal rate, regular rhythm and normal heart sounds.  No murmur heard. Pulmonary/Chest: Effort normal and breath sounds normal. No stridor. He has no decreased breath sounds. He has no wheezes. He has no rhonchi. He has no rales.  Abdominal: Bowel sounds are normal. He exhibits no distension. There is no abdominal tenderness. There is no rebound, no guarding and no CVA tenderness. A hernia is present. Hernia confirmed positive in the ventral area. Hernia confirmed negative in the left inguinal area.  Genitourinary: Right testis shows no mass, no swelling and no tenderness. Right testis is descended. Left testis shows no mass, no swelling and no tenderness. Left testis is descended. No hypospadias, penile erythema or penile tenderness. No discharge found.  Musculoskeletal:     Right lower leg: No edema.     Left lower leg: No edema.  Lymphadenopathy:    He has no cervical adenopathy.       Right: No inguinal adenopathy present.       Left: No inguinal adenopathy present.  Neurological: He is alert and oriented to person, place, and time.  Skin: Skin is warm and dry. He is not diaphoretic.  Psychiatric: He has a normal mood and affect. His behavior is normal.    BP 130/80   Pulse 62   Temp 97.6 F (36.4 C) (Oral)   Ht 5\' 9"  (1.753 m)   Wt 194 lb 6 oz  (88.2 kg)   SpO2 98%   BMI 28.70 kg/m  Wt Readings from Last 3 Encounters:  04/10/19 194 lb 6 oz (88.2 kg)  12/29/18 200 lb (90.7 kg)  02/14/13 181 lb (82.1 kg)   BP Readings from Last 3 Encounters:  04/10/19 130/80  12/29/18 (!) 152/96  02/14/13 122/88   Guideline developer:  UpToDate (see UpToDate for funding source) Date Released: June 2014  There are no preventive care reminders to display for this patient.  There are no preventive care reminders to display for this patient.  Lab Results  Component Value Date   TSH 3.58 08/03/2012  Lab Results  Component Value Date   WBC 5.1 08/03/2012   HGB 14.0 08/03/2012   HCT 41.8 08/03/2012   MCV 92.4 08/03/2012   PLT 162.0 08/03/2012   Lab Results  Component Value Date   NA 141 08/03/2012   K 4.1 08/03/2012   CO2 30 08/03/2012   GLUCOSE 77 08/03/2012   BUN 14 08/03/2012   CREATININE 1.1 08/03/2012   BILITOT 1.1 08/03/2012   ALKPHOS 48 08/03/2012   AST 31 08/03/2012   ALT 35 08/03/2012   PROT 7.3 08/03/2012   ALBUMIN 4.1 08/03/2012   CALCIUM 9.7 08/03/2012   GFR 93.09 08/03/2012   Lab Results  Component Value Date   CHOL 119 08/03/2012   Lab Results  Component Value Date   HDL 53.50 08/03/2012   Lab Results  Component Value Date   LDLCALC 36 08/03/2012   Lab Results  Component Value Date   TRIG 148.0 08/03/2012   Lab Results  Component Value Date   CHOLHDL 2 08/03/2012   No results found for: HGBA1C    Assessment & Plan:   Problem List Items Addressed This Visit      Cardiovascular and Mediastinum   Essential hypertension - Primary   Relevant Medications   chlorthalidone (HYGROTON) 25 MG tablet   Other Relevant Orders   CBC   Comprehensive metabolic panel   Urinalysis, Routine w reflex microscopic   EKG 12-Lead (Completed)     Other   Health care maintenance   Relevant Orders   CBC   Comprehensive metabolic panel   Lipid panel   Urinalysis, Routine w reflex microscopic   PSA    SOB (shortness of breath)   Relevant Orders   CBC   Lipid panel   TSH   EKG 12-Lead (Completed)   DG Chest 2 View      Meds ordered this encounter  Medications  . chlorthalidone (HYGROTON) 25 MG tablet    Sig: Take 1 tablet (25 mg total) by mouth daily.    Dispense:  90 tablet    Refill:  0    Follow-up: Return in about 3 months (around 07/11/2019).   Patient will check and record his blood pressure over the next 3 months.  Follow-up in 3 months for recheck or sooner if his shortness of breath creases or if he develops any chest pain that is related to exertion.  Patient was invited to try some over-the-counter Prilosec to see if that may help.  Patient was given information for shortness of breath and health maintenance.

## 2019-04-10 NOTE — Patient Instructions (Signed)
Shortness of Breath, Adult Shortness of breath is when a person has trouble breathing enough air or when a person feels like she or he is having trouble breathing in enough air. Shortness of breath could be a sign of a medical problem. Follow these instructions at home:   Pay attention to any changes in your symptoms.  Do not use any products that contain nicotine or tobacco, such as cigarettes, e-cigarettes, and chewing tobacco.  Do not smoke. Smoking is a common cause of shortness of breath. If you need help quitting, ask your health care provider.  Avoid things that can irritate your airways, such as: ? Mold. ? Dust. ? Air pollution. ? Chemical fumes. ? Things that can cause allergy symptoms (allergens), if you have allergies.  Keep your living space clean and free of mold and dust.  Rest as needed. Slowly return to your usual activities.  Take over-the-counter and prescription medicines only as told by your health care provider. This includes oxygen therapy and inhaled medicines.  Keep all follow-up visits as told by your health care provider. This is important. Contact a health care provider if:  Your condition does not improve as soon as expected.  You have a hard time doing your normal activities, even after you rest.  You have new symptoms. Get help right away if:  Your shortness of breath gets worse.  You have shortness of breath when you are resting.  You feel light-headed or you faint.  You have a cough that is not controlled with medicines.  You cough up blood.  You have pain with breathing.  You have pain in your chest, arms, shoulders, or abdomen.  You have a fever.  You cannot walk up stairs or exercise the way that you normally do. These symptoms may represent a serious problem that is an emergency. Do not wait to see if the symptoms will go away. Get medical help right away. Call your local emergency services (911 in the [place] Do not drive yourself  to the hospital. Summary  Shortness of breath is when a person has trouble breathing enough air. It can be a sign of a medical problem.  Avoid things that irritate your lungs, such as smoking, pollution, mold, and dust.  Pay attention to changes in your symptoms and contact your health care provider if you have a hard time completing daily activities because of shortness of breath. This information is not intended to replace advice given to you by your health care provider. Make sure you discuss any questions you have with your health care provider. Document Released: 07/06/2001 Document Revised: 03/13/2018 Document Reviewed: 03/13/2018 Elsevier Interactive Patient Education  2019 [place] Maintenance, Male A healthy lifestyle and preventive care is important for your health and wellness. Ask your health care provider about what schedule of regular examinations is right for you. What should I know about weight and diet? Eat a Healthy Diet  Eat plenty of vegetables, fruits, whole grains, low-fat dairy products, and lean protein.  Do not eat a lot of foods high in solid fats, added sugars, or salt.  Maintain a Healthy Weight Regular exercise can help you achieve or maintain a healthy weight. You should:  Do at least 150 minutes of exercise each week. The exercise should increase your heart rate and make you sweat (moderate-intensity exercise).  Do strength-training exercises at least twice a week. Watch Your Levels of Cholesterol and Blood Lipids  Have your blood tested for lipids and  cholesterol every 5 years starting at 49 years of age. If you are at high risk for heart disease, you should start having your blood tested when you are 49 years old. You may need to have your cholesterol levels checked more often if: ? Your lipid or cholesterol levels are high. ? You are older than 49 years of age. ? You are at high risk for heart disease. What should I know about cancer  screening? Many types of cancers can be detected early and may often be prevented. Lung Cancer  You should be screened every year for lung cancer if: ? You are a current smoker who has smoked for at least 30 years. ? You are a former smoker who has quit within the past 15 years.  Talk to your health care provider about your screening options, when you should start screening, and how often you should be screened. Colorectal Cancer  Routine colorectal cancer screening usually begins at 49 years of age and should be repeated every 5-10 years until you are 49 years old. You may need to be screened more often if early forms of precancerous polyps or small growths are found. Your health care provider may recommend screening at an earlier age if you have risk factors for colon cancer.  Your health care provider may recommend using home test kits to check for hidden blood in the stool.  A small camera at the end of a tube can be used to examine your colon (sigmoidoscopy or colonoscopy). This checks for the earliest forms of colorectal cancer. Prostate and Testicular Cancer  Depending on your age and overall health, your health care provider may do certain tests to screen for prostate and testicular cancer.  Talk to your health care provider about any symptoms or concerns you have about testicular or prostate cancer. Skin Cancer  Check your skin from head to toe regularly.  Tell your health care provider about any new moles or changes in moles, especially if: ? There is a change in a mole's size, shape, or color. ? You have a mole that is larger than a pencil eraser.  Always use sunscreen. Apply sunscreen liberally and repeat throughout the day.  Protect yourself by wearing long sleeves, pants, a wide-brimmed hat, and sunglasses when outside. What should I know about heart disease, diabetes, and high blood pressure?  If you are 89-69 years of age, have your blood pressure checked every 3-5  years. If you are 53 years of age or older, have your blood pressure checked every year. You should have your blood pressure measured twice-once when you are at a hospital or clinic, and once when you are not at a hospital or clinic. Record the average of the two measurements. To check your blood pressure when you are not at a hospital or clinic, you can use: ? An automated blood pressure machine at a pharmacy. ? A home blood pressure monitor.  Talk to your health care provider about your target blood pressure.  If you are between 52-19 years old, ask your health care provider if you should take aspirin to prevent heart disease.  Have regular diabetes screenings by checking your fasting blood sugar level. ? If you are at a normal weight and have a low risk for diabetes, have this test once every three years after the age of 10. ? If you are overweight and have a high risk for diabetes, consider being tested at a younger age or more often.  A  one-time screening for abdominal aortic aneurysm (AAA) by ultrasound is recommended for men aged 20-75 years who are current or former smokers. What should I know about preventing infection? Hepatitis B If you have a higher risk for hepatitis B, you should be screened for this virus. Talk with your health care provider to find out if you are at risk for hepatitis B infection. Hepatitis C Blood testing is recommended for:  Everyone born from [birth year] through 1966.  Anyone with known risk factors for hepatitis C. Sexually Transmitted Diseases (STDs)  You should be screened each year for STDs including gonorrhea and chlamydia if: ? You are sexually active and are younger than 49 years of age. ? You are older than 49 years of age and your health care provider tells you that you are at risk for this type of infection. ? Your sexual activity has changed since you were last screened and you are at an increased risk for chlamydia or gonorrhea. Ask your health care  provider if you are at risk.  Talk with your health care provider about whether you are at high risk of being infected with HIV. Your health care provider may recommend a prescription medicine to help prevent HIV infection. What else can I do?  Schedule regular health, dental, and eye exams.  Stay current with your vaccines (immunizations).  Do not use any tobacco products, such as cigarettes, chewing tobacco, and e-cigarettes. If you need help quitting, ask your health care provider.  Limit alcohol intake to no more than 2 drinks per day. One drink equals 12 ounces of beer, 5 ounces of wine, or 1 ounces of hard liquor.  Do not use street drugs.  Do not share needles.  Ask your health care provider for help if you need support or information about quitting drugs.  Tell your health care provider if you often feel depressed.  Tell your health care provider if you have ever been abused or do not feel safe at home. This information is not intended to replace advice given to you by your health care provider. Make sure you discuss any questions you have with your health care provider. Document Released: 04/08/2008 Document Revised: 06/09/2016 Document Reviewed: 07/15/2015 Elsevier Interactive Patient Education  2019 [employer].

## 2019-07-11 ENCOUNTER — Telehealth: Payer: Self-pay

## 2019-07-11 NOTE — Telephone Encounter (Signed)

## 2019-07-12 ENCOUNTER — Encounter: Payer: Self-pay | Admitting: Family Medicine

## 2019-07-12 ENCOUNTER — Other Ambulatory Visit: Payer: Self-pay

## 2019-07-12 ENCOUNTER — Ambulatory Visit (INDEPENDENT_AMBULATORY_CARE_PROVIDER_SITE_OTHER): Payer: 59 | Admitting: Family Medicine

## 2019-07-12 VITALS — BP 122/70 | HR 78 | Ht 69.0 in | Wt 195.2 lb

## 2019-07-12 DIAGNOSIS — H6122 Impacted cerumen, left ear: Secondary | ICD-10-CM

## 2019-07-12 DIAGNOSIS — I10 Essential (primary) hypertension: Secondary | ICD-10-CM

## 2019-07-12 MED ORDER — CHLORTHALIDONE 25 MG PO TABS: 25.0000 mg | ORAL_TABLET | Freq: Every day | ORAL | 1 refills | Status: DC

## 2019-07-12 MED ORDER — EAR WAX CLEANSING 6.5 % OT KIT: PACK | OTIC | 2 refills | Status: DC

## 2019-07-12 MED ORDER — CHLORTHALIDONE 25 MG PO TABS: 25.0000 mg | ORAL_TABLET | Freq: Every day | ORAL | 0 refills | Status: DC

## 2019-07-12 NOTE — Patient Instructions (Signed)
Managing Your Hypertension Hypertension is commonly called high blood pressure. This is when the force of your blood pressing against the walls of your arteries is too strong. Arteries are blood vessels that carry blood from your heart throughout your body. Hypertension forces the heart to work harder to pump blood, and may cause the arteries to become narrow or stiff. Having untreated or uncontrolled hypertension can cause heart attack, stroke, kidney disease, and other problems. What are blood pressure readings? A blood pressure reading consists of a higher number over a lower number. Ideally, your blood pressure should be below 120/80. The first ("top") number is called the systolic pressure. It is a measure of the pressure in your arteries as your heart beats. The second ("bottom") number is called the diastolic pressure. It is a measure of the pressure in your arteries as the heart relaxes. What does my blood pressure reading mean? Blood pressure is classified into four stages. Based on your blood pressure reading, your health care provider may use the following stages to determine what type of treatment you need, if any. Systolic pressure and diastolic pressure are measured in a unit called mm Hg. Normal  Systolic pressure: below 120.  Diastolic pressure: below 80. Elevated  Systolic pressure: 120-129.  Diastolic pressure: below 80. Hypertension stage 1  Systolic pressure: 130-139.  Diastolic pressure: 80-89. Hypertension stage 2  Systolic pressure: 140 or above.  Diastolic pressure: 90 or above. What health risks are associated with hypertension? Managing your hypertension is an important responsibility. Uncontrolled hypertension can lead to:  A heart attack.  A stroke.  A weakened blood vessel (aneurysm).  Heart failure.  Kidney damage.  Eye damage.  Metabolic syndrome.  Memory and concentration problems. What changes can I make to manage my  hypertension? Hypertension can be managed by making lifestyle changes and possibly by taking medicines. Your health care provider will help you make a plan to bring your blood pressure within a normal range. Eating and drinking   Eat a diet that is high in fiber and potassium, and low in salt (sodium), added sugar, and fat. An example eating plan is called the DASH (Dietary Approaches to Stop Hypertension) diet. To eat this way: ? Eat plenty of fresh fruits and vegetables. Try to fill half of your plate at each meal with fruits and vegetables. ? Eat whole grains, such as whole wheat pasta, brown rice, or whole grain bread. Fill about one quarter of your plate with whole grains. ? Eat low-fat diary products. ? Avoid fatty cuts of meat, processed or cured meats, and poultry with skin. Fill about one quarter of your plate with lean proteins such as fish, chicken without skin, beans, eggs, and tofu. ? Avoid premade and processed foods. These tend to be higher in sodium, added sugar, and fat.  Reduce your daily sodium intake. Most people with hypertension should eat less than 1,500 mg of sodium a day.  Limit alcohol intake to no more than 1 drink a day for nonpregnant women and 2 drinks a day for men. One drink equals 12 oz of beer, 5 oz of wine, or 1 oz of hard liquor. Lifestyle  Work with your health care provider to maintain a healthy body weight, or to lose weight. Ask what an ideal weight is for you.  Get at least 30 minutes of exercise that causes your heart to beat faster (aerobic exercise) most days of the week. Activities may include walking, swimming, or biking.  Include exercise   to strengthen your muscles (resistance exercise), such as weight lifting, as part of your weekly exercise routine. Try to do these types of exercises for 30 minutes at least 3 days a week.  Do not use any products that contain nicotine or tobacco, such as cigarettes and e-cigarettes. If you need help quitting,  ask your health care provider.  Control any long-term (chronic) conditions you have, such as high cholesterol or diabetes. Monitoring  Monitor your blood pressure at home as told by your health care provider. Your personal target blood pressure may vary depending on your medical conditions, your age, and other factors.  Have your blood pressure checked regularly, as often as told by your health care provider. Working with your health care provider  Review all the medicines you take with your health care provider because there may be side effects or interactions.  Talk with your health care provider about your diet, exercise habits, and other lifestyle factors that may be contributing to hypertension.  Visit your health care provider regularly. Your health care provider can help you create and adjust your plan for managing hypertension. Will I need medicine to control my blood pressure? Your health care provider may prescribe medicine if lifestyle changes are not enough to get your blood pressure under control, and if:  Your systolic blood pressure is 130 or higher.  Your diastolic blood pressure is 80 or higher. Take medicines only as told by your health care provider. Follow the directions carefully. Blood pressure medicines must be taken as prescribed. The medicine does not work as well when you skip doses. Skipping doses also puts you at risk for problems. Contact a health care provider if:  You think you are having a reaction to medicines you have taken.  You have repeated (recurrent) headaches.  You feel dizzy.  You have swelling in your ankles.  You have trouble with your vision. Get help right away if:  You develop a severe headache or confusion.  You have unusual weakness or numbness, or you feel faint.  You have severe pain in your chest or abdomen.  You vomit repeatedly.  You have trouble breathing. Summary  Hypertension is when the force of blood pumping  through your arteries is too strong. If this condition is not controlled, it may put you at risk for serious complications.  Your personal target blood pressure may vary depending on your medical conditions, your age, and other factors. For most people, a normal blood pressure is less than 120/80.  Hypertension is managed by lifestyle changes, medicines, or both. Lifestyle changes include weight loss, eating a healthy, low-sodium diet, exercising more, and limiting alcohol. This information is not intended to replace advice given to you by your health care provider. Make sure you discuss any questions you have with your health care provider. Document Released: 07/05/2012 Document Revised: 02/02/2019 Document Reviewed: 09/08/2016 Elsevier Patient Education  2020 [employer]  

## 2019-07-12 NOTE — Progress Notes (Signed)
Established Patient Office Visit  Subjective:  Patient ID: John Chen, male    DOB: 08/30/1971  Age: 48 y.o. MRN: [id number]  CC:  Chief Complaint  Patient presents with  . Follow-up    HPI Ward Boissonneault presents for follow-up of his hypertension.  Blood pressure is been controlled with the chlorthalidone and he is tolerating regular drug well.  He has no side effects.  Right elbow has improved and is no longer painful.  He has backed off on the weight and he has been lifting.  He is back in the gym.  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  Things are well at home.  He did see the eye doctor this year for dilated exam Glasses.  He has yet to schedule dental care follow-up.  He is nonfasting today.  Believes that he was nonfasting blood work was drawn that had shown elevated lipids.  Past Medical History:  Diagnosis Date  . Hypertension     History reviewed. No pertinent surgical history.  Family History  Problem Relation Age of Onset  . Hypertension Mother   . Hypertension Father   . Asthma Sister   . Hypertension Sister   . Drug abuse Brother   . Early death Brother   . Heart attack Brother   . Hypertension Maternal Grandmother   . Asthma Sister   . Alcohol abuse Neg Hx   . Cancer Neg Hx   . Diabetes Neg Hx   . Hearing loss Neg Hx   . Heart disease Neg Hx   . Hyperlipidemia Neg Hx   . Kidney disease Neg Hx   . Stroke Neg Hx     Social History   Socioeconomic History  . Marital status: Married    Spouse name: Not on file  . Number of children: Not on file  . Years of education: Not on file  . Highest education level: Not on file  Occupational History  . Not on file  Social Needs  . Financial resource strain: Not on file  . Food insecurity    Worry: Not on file    Inability: Not on file  . Transportation needs    Medical: Not on file    Non-medical: Not on file  Tobacco Use  . Smoking status: Never Smoker  . Smokeless tobacco: Never Used  Substance and  Sexual Activity  . Alcohol use: Yes    Alcohol/week: 5.0 standard drinks    Types: 5 Glasses of wine per week  . Drug use: No  . Sexual activity: Yes  Lifestyle  . Physical activity    Days per week: Not on file    Minutes per session: Not on file  . Stress: Not on file  Relationships  . Social Herbalist on phone: Not on file    Gets together: Not on file    Attends religious service: Not on file    Active member of club or organization: Not on file    Attends meetings of clubs or organizations: Not on file    Relationship status: Not on file  . Intimate partner violence    Fear of current or ex partner: Not on file    Emotionally abused: Not on file    Physically abused: Not on file    Forced sexual activity: Not on file  Other Topics Concern  . Not on file  Social History Narrative  . Not on file    Outpatient Medications Prior to Visit  Medication Sig Dispense Refill  . chlorthalidone (HYGROTON) 25 MG tablet Take 1 tablet (25 mg total) by mouth daily. 90 tablet 0   No facility-administered medications prior to visit.     No Known Allergies  ROS Review of Systems  Constitutional: Negative for diaphoresis, fatigue, fever and unexpected weight change.  HENT: Negative.  Negative for ear discharge, ear pain and hearing loss.   Eyes: Negative for photophobia and visual disturbance.  Respiratory: Negative.   Cardiovascular: Negative.   Gastrointestinal: Negative.   Endocrine: Negative for polyphagia and polyuria.  Musculoskeletal: Negative for gait problem.  Skin: Negative for pallor.  Neurological: Negative for seizures and speech difficulty.  Hematological: Negative.   Psychiatric/Behavioral: Negative.       Objective:    Physical Exam  Constitutional: He is oriented to person, place, and time. He appears well-developed and well-nourished. No distress.  HENT:  Head: Normocephalic and atraumatic.  Right Ear: Tympanic membrane, external ear and ear  canal normal.  Left Ear: External ear normal. A foreign body is present.  Ears:  Mouth/Throat: Oropharynx is clear and moist. No oropharyngeal exudate.  Eyes: Pupils are equal, round, and reactive to light. Conjunctivae are normal. Right eye exhibits no discharge. Left eye exhibits no discharge. No scleral icterus.  Neck: No JVD present. No tracheal deviation present. No thyromegaly present.  Cardiovascular: Normal rate, regular rhythm and normal heart sounds.  Pulmonary/Chest: Effort normal and breath sounds normal. No stridor.  Musculoskeletal:        General: No edema.  Lymphadenopathy:    He has no cervical adenopathy.  Neurological: He is alert and oriented to person, place, and time.  Skin: Skin is warm and dry. He is not diaphoretic.  Psychiatric: He has a normal mood and affect. His behavior is normal.    BP 122/70   Pulse 78   Ht _0  (1.753 m)   Wt 195 lb 4 oz (88.6 kg)   SpO2 97%   BMI 28.83 kg/m  Wt Readings from Last 3 Encounters:  07/12/19 195 lb 4 oz (88.6 kg)  04/10/19 194 lb 6 oz (88.2 kg)  12/29/18 200 lb (90.7 kg)   BP Readings from Last 3 Encounters:  07/12/19 122/70  04/10/19 130/80  12/29/18 (!) 152/96   Guideline developer:  UpToDate (see UpToDate for funding source) Date Released: June 2014  There are no preventive care reminders to display for this patient.  There are no preventive care reminders to display for this patient.  Lab Results  Component Value Date   TSH 1.81 04/10/2019   Lab Results  Component Value Date   WBC 4.6 04/10/2019   HGB 14.2 04/10/2019   HCT 42.5 04/10/2019   MCV 91.0 04/10/2019   PLT 202.0 04/10/2019   Lab Results  Component Value Date   NA 136 04/10/2019   K 4.1 04/10/2019   CO2 29 04/10/2019   GLUCOSE 113 (H) 04/10/2019   BUN 27 (H) 04/10/2019   CREATININE 1.08 04/10/2019   BILITOT 0.7 04/10/2019   ALKPHOS 45 04/10/2019   AST 31 04/10/2019   ALT 39 04/10/2019   PROT 7.1 04/10/2019   ALBUMIN 4.5  04/10/2019   CALCIUM 9.7 04/10/2019   GFR 87.77 04/10/2019   Lab Results  Component Value Date   CHOL 174 04/10/2019   Lab Results  Component Value Date   HDL 46.40 04/10/2019   Lab Results  Component Value Date   LDLCALC 36 08/03/2012   Lab Results  Component Value  Date   TRIG 228.0 (H) 04/10/2019   Lab Results  Component Value Date   CHOLHDL 4 04/10/2019   No results found for: HGBA1C     Subjective:    John Chen is a 48 y.o. male whom I am asked to see for evaluation of discharge in the left ear for the past years years. There is a prior history of cerumen impaction. The patient has not been using ear drops to loosen wax immediately prior to this visit. The patient denies ear pain.  The patient's history has been marked as reviewed and updated as appropriate.  Review of Systems Pertinent items are noted in HPI.    Objective:    Auditory canal(s) of the left ear are partially obstructed with cerumen.   Cerumen was removed using gentle irrigation and soft plastic curettes. Tympanic membranes are intact following the procedure.  Auditory canals are normal.    Assessment:    Cerumen Impaction without otitis externa.    Plan:    1. Care instructions given. 2. Home treatment: none. 3. Follow-up as needed.  Assessment & Plan:   Problem List Items Addressed This Visit      Cardiovascular and Mediastinum   Essential hypertension - Primary   Relevant Medications   chlorthalidone (HYGROTON) 25 MG tablet   Other Relevant Orders   Basic metabolic panel     Nervous and Auditory   Excessive cerumen in left ear canal   Relevant Medications   Carbamide Peroxide-Saline (EAR WAX CLEANSING) 6.5 % KIT      Meds ordered this encounter  Medications  . DISCONTD: chlorthalidone (HYGROTON) 25 MG tablet    Sig: Take 1 tablet (25 mg total) by mouth daily.    Dispense:  90 tablet    Refill:  0  . Carbamide Peroxide-Saline (EAR WAX CLEANSING) 6.5 % KIT    Sig: As  directed per kit.    Dispense:  1 kit    Refill:  2  . chlorthalidone (HYGROTON) 25 MG tablet    Sig: Take 1 tablet (25 mg total) by mouth daily.    Dispense:  90 tablet    Refill:  1    Follow-up: Return in about 6 months (around 01/09/2020).   Fasting for physical.   Patient will return fasting in March for a physical.  Advised him not to use Q-tips to clean his ears.  Given prescription for earwax removal kit.  Advised to use the oil supplied in the kit to soften the wax and then followed by irrigation.  Right ear is clear.  He was given information on managing hypertension.

## 2019-07-13 LAB — BASIC METABOLIC PANEL
BUN: 25 mg/dL — ABNORMAL HIGH (ref 6–23)
CO2: 28 mEq/L (ref 19–32)
Calcium: 10.1 mg/dL (ref 8.4–10.5)
Chloride: 100 mEq/L (ref 96–112)
Creatinine, Ser: 1.29 mg/dL (ref 0.40–1.50)
GFR: 71.42 mL/min (ref >60.00)
Glucose, Bld: 102 mg/dL — ABNORMAL HIGH (ref 70–99)
Potassium: 3.9 mEq/L (ref 3.5–5.1)
Sodium: 138 mEq/L (ref 135–145)

## 2019-10-26 ENCOUNTER — Encounter: Payer: Self-pay | Admitting: Family Medicine

## 2019-10-29 ENCOUNTER — Other Ambulatory Visit: Payer: Self-pay

## 2019-10-29 DIAGNOSIS — I10 Essential (primary) hypertension: Secondary | ICD-10-CM

## 2019-10-29 MED ORDER — CHLORTHALIDONE 25 MG PO TABS: 25.0000 mg | ORAL_TABLET | Freq: Every day | ORAL | 0 refills | Status: DC

## 2019-11-22 ENCOUNTER — Other Ambulatory Visit: Payer: Self-pay | Admitting: Family Medicine

## 2019-11-22 DIAGNOSIS — I10 Essential (primary) hypertension: Secondary | ICD-10-CM

## 2019-11-23 NOTE — Telephone Encounter (Signed)
Called patient who verbally understood that he has a refill at his pharmacy already to be picked up. Patient due for OV scheduled an appointment to come in March.

## 2019-12-31 ENCOUNTER — Other Ambulatory Visit: Payer: Self-pay

## 2020-01-01 ENCOUNTER — Ambulatory Visit (INDEPENDENT_AMBULATORY_CARE_PROVIDER_SITE_OTHER): Payer: 59 | Admitting: Family Medicine

## 2020-01-01 ENCOUNTER — Encounter: Payer: Self-pay | Admitting: Family Medicine

## 2020-01-01 VITALS — BP 100/80 | HR 73 | Temp 97.5°F | Ht 69.0 in | Wt 185.0 lb

## 2020-01-01 DIAGNOSIS — D229 Melanocytic nevi, unspecified: Secondary | ICD-10-CM | POA: Diagnosis not present

## 2020-01-01 DIAGNOSIS — E782 Mixed hyperlipidemia: Secondary | ICD-10-CM

## 2020-01-01 DIAGNOSIS — I10 Essential (primary) hypertension: Secondary | ICD-10-CM | POA: Diagnosis not present

## 2020-01-01 MED ORDER — CHLORTHALIDONE 25 MG PO TABS: 25.0000 mg | ORAL_TABLET | Freq: Every day | ORAL | 0 refills | Status: DC

## 2020-01-01 NOTE — Patient Instructions (Signed)
Preventing High Cholesterol Cholesterol is a white, waxy substance similar to fat that the human body needs to help build cells. The liver makes all the cholesterol that a person's body needs. Having high cholesterol (hypercholesterolemia) increases a person's risk for heart disease and stroke. Extra (excess) cholesterol comes from the food the person eats. High cholesterol can often be prevented with diet and lifestyle changes. If you already have high cholesterol, you can control it with diet and lifestyle changes and with medicine. How can high cholesterol affect me? If you have high cholesterol, deposits (plaques) may build up on the walls of your arteries. The arteries are the blood vessels that carry blood away from your heart. Plaques make the arteries narrower and stiffer. This can limit or block blood flow and cause blood clots to form. Blood clots:  Are tiny balls of cells that form in your blood.  Can move to the heart or brain, causing a heart attack or stroke. Plaques in arteries greatly increase your risk for heart attack and stroke.Making diet and lifestyle changes can reduce your risk for these conditions that may threaten your life. What can increase my risk? This condition is more likely to develop in people who:  Eat foods that are high in saturated fat or cholesterol. Saturated fat is mostly found in: ? Foods that contain animal fat, such as red meat and some dairy products. ? Certain fatty foods made from plants, such as tropical oils.  Are overweight.  Are not getting enough exercise.  Have a family history of high cholesterol. What actions can I take to prevent this? Nutrition   Eat less saturated fat.  Avoid trans fats (partially hydrogenated oils). These are often found in margarine and in some baked goods, fried foods, and snacks bought in packages.  Avoid precooked or cured meat, such as sausages or meat loaves.  Avoid foods and drinks that have added  sugars.  Eat more fruits, vegetables, and whole grains.  Choose healthy sources of protein, such as fish, poultry, lean cuts of red meat, beans, peas, lentils, and nuts.  Choose healthy sources of fat, such as: ? Nuts. ? Vegetable oils, especially olive oil. ? Fish that have healthy fats (omega-3 fatty acids), such as mackerel or salmon. The items listed above may not be a complete list of recommended foods and beverages. Contact a dietitian for more information. Lifestyle  Lose weight if you are overweight. Losing 5-10 lb (2.3-4.5 kg) can help prevent or control high cholesterol. It can also lower your risk for diabetes and high blood pressure. Ask your health care provider to help you with a diet and exercise plan to lose weight safely.  Do not use any products that contain nicotine or tobacco, such as cigarettes, e-cigarettes, and chewing tobacco. If you need help quitting, ask your health care provider.  Limit your alcohol intake. ? Do not drink alcohol if:  Your health care provider tells you not to drink.  You are pregnant, may be pregnant, or are planning to become pregnant. ? If you drink alcohol:  Limit how much you use to:  0-1 drink a day for women.  0-2 drinks a day for men.  Be aware of how much alcohol is in your drink. In the [place] one drink equals one 12 oz bottle of beer (355 mL), one 5 oz glass of wine (148 mL), or one 1 oz glass of hard liquor (44 mL). Activity   Get enough exercise. Each week, do at   least 150 minutes of exercise that takes a medium level of effort (moderate-intensity exercise). ? This is exercise that:  Makes your heart beat faster and makes you breathe harder than usual.  Allows you to still be able to talk. ? You could exercise in short sessions several times a day or longer sessions a few times a week. For example, on 5 days each week, you could walk fast or ride your bike 3 times a day for 10 minutes each time.  Do exercises as told  by your health care provider. Medicines  In addition to diet and lifestyle changes, your health care provider may recommend medicines to help lower cholesterol. This may be a medicine to lower the amount of cholesterol your liver makes. You may need medicine if: ? Diet and lifestyle changes do not lower your cholesterol enough. ? You have high cholesterol and other risk factors for heart disease or stroke.  Take over-the-counter and prescription medicines only as told by your health care provider. General information  Manage your risk factors for high cholesterol. Talk with your health care provider about all your risk factors and how to lower your risk.  Manage other conditions that you have, such as diabetes or high blood pressure (hypertension).  Have blood tests to check your cholesterol levels at regular points in time as told by your health care provider.  Keep all follow-up visits as told by your health care provider. This is important. Where to find more information  American Heart Association: www.heart.org  National Heart, Lung, and Blood Institute: www.nhlbi.nih.gov Summary  High cholesterol increases your risk for heart disease and stroke. By keeping your cholesterol level low, you can reduce your risk for these conditions.  High cholesterol can often be prevented with diet and lifestyle changes.  Work with your health care provider to manage your risk factors, and have your blood tested regularly. This information is not intended to replace advice given to you by your health care provider. Make sure you discuss any questions you have with your health care provider. Document Revised: 02/02/2019 Document Reviewed: 06/19/2016 Elsevier Patient Education  2020 Elsevier Inc.  

## 2020-01-01 NOTE — Progress Notes (Signed)
Established Patient Office Visit  Subjective:  Patient ID: John Chen, male    DOB: [birth year]  Age: 49 y.o. MRN: [id number]  CC:  Chief Complaint  Patient presents with  . Follow-up    Pt would like to discuss some stomach issues.  Pt c/o some skin spots that he has noticed  along with some moles x 1 month ago  Pt is fasting in case he needs labs.    HPI John Chen presents for follow-up of his blood pressure, elevated triglycerides and concerned about the development of new moles about his body.  He gets a scaling dermatitis in his scalp if he lets his hair grow too long.  He is tolerating the chlorthalidone well.  He controls his blood pressure well.  He is having no issues taking it  Past Medical History:  Diagnosis Date  . Hypertension     History reviewed. No pertinent surgical history.  Family History  Problem Relation Age of Onset  . Hypertension Mother   . Hypertension Father   . Asthma Sister   . Hypertension Sister   . Drug abuse Brother   . Early death Brother   . Heart attack Brother   . Hypertension Maternal Grandmother   . Asthma Sister   . Alcohol abuse Neg Hx   . Cancer Neg Hx   . Diabetes Neg Hx   . Hearing loss Neg Hx   . Heart disease Neg Hx   . Hyperlipidemia Neg Hx   . Kidney disease Neg Hx   . Stroke Neg Hx     Social History   Socioeconomic History  . Marital status: Married    Spouse name: Not on file  . Number of children: Not on file  . Years of education: Not on file  . Highest education level: Not on file  Occupational History  . Not on file  Tobacco Use  . Smoking status: Never Smoker  . Smokeless tobacco: Never Used  Substance and Sexual Activity  . Alcohol use: Yes    Alcohol/week: 5.0 standard drinks    Types: 5 Glasses of wine per week  . Drug use: No  . Sexual activity: Yes  Other Topics Concern  . Not on file  Social History Narrative  . Not on file   Social Determinants of Health   Financial Resource  Strain:   . Difficulty of Paying Living Expenses: Not on file  Food Insecurity:   . Worried About Charity fundraiser in the Last Year: Not on file  . Ran Out of Food in the Last Year: Not on file  Transportation Needs:   . Lack of Transportation (Medical): Not on file  . Lack of Transportation (Non-Medical): Not on file  Physical Activity:   . Days of Exercise per Week: Not on file  . Minutes of Exercise per Session: Not on file  Stress:   . Feeling of Stress : Not on file  Social Connections:   . Frequency of Communication with Friends and Family: Not on file  . Frequency of Social Gatherings with Friends and Family: Not on file  . Attends Religious Services: Not on file  . Active Member of Clubs or Organizations: Not on file  . Attends Archivist Meetings: Not on file  . Marital Status: Not on file  Intimate Partner Violence:   . Fear of Current or Ex-Partner: Not on file  . Emotionally Abused: Not on file  . Physically Abused: Not on  file  . Sexually Abused: Not on file    Outpatient Medications Prior to Visit  Medication Sig Dispense Refill  . vitamin B-12 (CYANOCOBALAMIN) 500 MCG tablet Take by mouth.    . chlorthalidone (HYGROTON) 25 MG tablet Take 1 tablet (25 mg total) by mouth daily. 90 tablet 0  . Carbamide Peroxide-Saline (EAR WAX CLEANSING) 6.5 % KIT As directed per kit. (Patient not taking: Reported on 01/01/2020) 1 kit 2   No facility-administered medications prior to visit.    No Known Allergies  ROS Review of Systems  Constitutional: Negative.   HENT: Negative.   Eyes: Negative for photophobia and visual disturbance.  Respiratory: Negative.   Cardiovascular: Negative.   Gastrointestinal: Negative.   Endocrine: Negative for polyphagia and polyuria.  Genitourinary: Negative.   Skin: Positive for color change and rash. Negative for wound.  Neurological: Negative for tremors and numbness.  Psychiatric/Behavioral: Negative.       Objective:     Physical Exam  Constitutional: He is oriented to person, place, and time. He appears well-developed and well-nourished. No distress.  HENT:  Head: Normocephalic and atraumatic.  Right Ear: External ear normal.  Left Ear: External ear normal.  Eyes: Conjunctivae are normal. Right eye exhibits no discharge. Left eye exhibits no discharge. No scleral icterus.  Neck: No JVD present. No tracheal deviation present. No thyromegaly present.  Cardiovascular: Normal rate, regular rhythm and normal heart sounds.  Pulmonary/Chest: Effort normal and breath sounds normal. No stridor.  Abdominal: Bowel sounds are normal.  Musculoskeletal:        General: No edema.  Lymphadenopathy:    He has no cervical adenopathy.  Neurological: He is alert and oriented to person, place, and time.  Skin: Skin is warm and dry. He is not diaphoretic.     Psychiatric: He has a normal mood and affect. His behavior is normal.    BP 100/80 (BP Location: Left Arm, Patient Position: Sitting, Cuff Size: Large)   Pulse 73   Temp (!) 97.5 F (36.4 C) (Temporal)   Ht '5\' 9"'  (1.753 m)   Wt 185 lb (83.9 kg)   SpO2 97%   BMI 27.32 kg/m  Wt Readings from Last 3 Encounters:  01/01/20 185 lb (83.9 kg)  07/12/19 195 lb 4 oz (88.6 kg)  04/10/19 194 lb 6 oz (88.2 kg)     Health Maintenance Due  Topic Date Due  . COLONOSCOPY  10/28/2019    There are no preventive care reminders to display for this patient.  Lab Results  Component Value Date   TSH 1.81 04/10/2019   Lab Results  Component Value Date   WBC 4.6 04/10/2019   HGB 14.2 04/10/2019   HCT 42.5 04/10/2019   MCV 91.0 04/10/2019   PLT 202.0 04/10/2019   Lab Results  Component Value Date   NA 138 07/12/2019   K 3.9 07/12/2019   CO2 28 07/12/2019   GLUCOSE 102 (H) 07/12/2019   BUN 25 (H) 07/12/2019   CREATININE 1.29 07/12/2019   BILITOT 0.7 04/10/2019   ALKPHOS 45 04/10/2019   AST 31 04/10/2019   ALT 39 04/10/2019   PROT 7.1 04/10/2019   ALBUMIN  4.5 04/10/2019   CALCIUM 10.1 07/12/2019   GFR 71.42 07/12/2019   Lab Results  Component Value Date   CHOL 174 04/10/2019   Lab Results  Component Value Date   HDL 46.40 04/10/2019   Lab Results  Component Value Date   LDLCALC 36 08/03/2012   Lab Results  Component Value Date   TRIG 228.0 (H) 04/10/2019   Lab Results  Component Value Date   CHOLHDL 4 04/10/2019   No results found for: HGBA1C    Assessment & Plan:   Problem List Items Addressed This Visit      Cardiovascular and Mediastinum   Essential hypertension   Relevant Medications   chlorthalidone (HYGROTON) 25 MG tablet   Other Relevant Orders   CBC   Comprehensive metabolic panel     Musculoskeletal and Integument   Atypical nevi   Relevant Orders   Ambulatory referral to Dermatology     Other   Elevated triglycerides with high cholesterol - Primary   Relevant Medications   chlorthalidone (HYGROTON) 25 MG tablet   Other Relevant Orders   Comprehensive metabolic panel   Lipid panel      Meds ordered this encounter  Medications  . chlorthalidone (HYGROTON) 25 MG tablet    Sig: Take 1 tablet (25 mg total) by mouth daily.    Dispense:  90 tablet    Refill:  0    Follow-up: Return in about 6 months (around 07/03/2020).    Libby Maw, MD

## 2020-01-02 LAB — COMPREHENSIVE METABOLIC PANEL
ALT: 33 U/L (ref 0–53)
AST: 29 U/L (ref 0–37)
Albumin: 4.6 g/dL (ref 3.5–5.2)
Alkaline Phosphatase: 47 U/L (ref 39–117)
BUN: 18 mg/dL (ref 6–23)
CO2: 30 mEq/L (ref 19–32)
Calcium: 10.4 mg/dL (ref 8.4–10.5)
Chloride: 99 mEq/L (ref 96–112)
Creatinine, Ser: 1.27 mg/dL (ref 0.40–1.50)
GFR: 72.58 mL/min (ref >60.00)
Glucose, Bld: 92 mg/dL (ref 70–99)
Potassium: 4.1 mEq/L (ref 3.5–5.1)
Sodium: 136 mEq/L (ref 135–145)
Total Bilirubin: 1.2 mg/dL (ref 0.2–1.2)
Total Protein: 7.9 g/dL (ref 6.0–8.3)

## 2020-01-02 LAB — CBC
HCT: 45.1 % (ref 39.0–52.0)
Hemoglobin: 15.1 g/dL (ref 13.0–17.0)
MCHC: 33.6 g/dL (ref 30.0–36.0)
MCV: 92 fl (ref 78.0–100.0)
Platelets: 220 10*3/uL (ref 150.0–400.0)
RBC: 4.9 Mil/uL (ref 4.22–5.81)
RDW: 12.7 % (ref 11.5–15.5)
WBC: 4.8 10*3/uL (ref 4.0–10.5)

## 2020-01-02 LAB — LIPID PANEL
Cholesterol: 168 mg/dL (ref 0–200)
HDL: 51.6 mg/dL (ref >39.00)
LDL Cholesterol: 94 mg/dL (ref 0–99)
NonHDL: 116.02
Total CHOL/HDL Ratio: 3
Triglycerides: 109 mg/dL (ref 0.0–149.0)
VLDL: 21.8 mg/dL (ref 0.0–40.0)

## 2020-02-26 ENCOUNTER — Ambulatory Visit: Payer: 59 | Admitting: Family Medicine

## 2020-02-26 ENCOUNTER — Encounter: Payer: Self-pay | Admitting: Family Medicine

## 2020-02-26 ENCOUNTER — Ambulatory Visit (INDEPENDENT_AMBULATORY_CARE_PROVIDER_SITE_OTHER): Payer: 59

## 2020-02-26 ENCOUNTER — Other Ambulatory Visit: Payer: Self-pay

## 2020-02-26 VITALS — BP 120/72 | HR 72 | Temp 97.5°F | Ht 69.0 in | Wt 187.0 lb

## 2020-02-26 DIAGNOSIS — I10 Essential (primary) hypertension: Secondary | ICD-10-CM

## 2020-02-26 DIAGNOSIS — S91311A Laceration without foreign body, right foot, initial encounter: Secondary | ICD-10-CM

## 2020-02-26 DIAGNOSIS — S9031XA Contusion of right foot, initial encounter: Secondary | ICD-10-CM | POA: Diagnosis not present
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
## 2020-02-28 ENCOUNTER — Other Ambulatory Visit: Payer: Self-pay | Admitting: Family Medicine

## 2020-02-28 DIAGNOSIS — I10 Essential (primary) hypertension: Secondary | ICD-10-CM

## 2020-02-29 MED ORDER — CHLORTHALIDONE 25 MG PO TABS: 25.0000 mg | ORAL_TABLET | Freq: Every day | ORAL | 0 refills | Status: DC

## 2020-02-29 NOTE — Telephone Encounter (Signed)
Last OV 02/26/20 Last fill 01/01/20  #90/0

## 2020-06-11 ENCOUNTER — Other Ambulatory Visit: Payer: Self-pay | Admitting: Family Medicine

## 2020-06-11 DIAGNOSIS — I10 Essential (primary) hypertension: Secondary | ICD-10-CM

## 2020-06-13 MED ORDER — CHLORTHALIDONE 25 MG PO TABS: 25.0000 mg | ORAL_TABLET | Freq: Every day | ORAL | 0 refills | Status: DC

## 2020-06-13 NOTE — Telephone Encounter (Signed)
Last seen 02/26/20 Chart supports use of Rx RTC around 07/03/20

## 2020-07-29 ENCOUNTER — Other Ambulatory Visit: Payer: Self-pay

## 2020-07-31 ENCOUNTER — Other Ambulatory Visit: Payer: Self-pay

## 2020-07-31 ENCOUNTER — Encounter: Payer: Self-pay | Admitting: Family Medicine

## 2020-07-31 ENCOUNTER — Ambulatory Visit: Payer: BC Managed Care – PPO | Admitting: Family Medicine

## 2020-07-31 VITALS — BP 122/84 | HR 66 | Temp 97.0°F | Ht 69.0 in | Wt 191.4 lb

## 2020-07-31 DIAGNOSIS — M5412 Radiculopathy, cervical region: Secondary | ICD-10-CM | POA: Insufficient documentation

## 2020-07-31 DIAGNOSIS — I10 Essential (primary) hypertension: Secondary | ICD-10-CM | POA: Diagnosis not present

## 2020-07-31 DIAGNOSIS — R1012 Left upper quadrant pain: Secondary | ICD-10-CM | POA: Insufficient documentation

## 2020-07-31 DIAGNOSIS — S46819A Strain of other muscles, fascia and tendons at shoulder and upper arm level, unspecified arm, initial encounter: Secondary | ICD-10-CM

## 2020-07-31 DIAGNOSIS — Z0000 Encounter for general adult medical examination without abnormal findings: Secondary | ICD-10-CM

## 2020-07-31 MED ORDER — CHLORTHALIDONE 25 MG PO TABS: 25.0000 mg | ORAL_TABLET | Freq: Every day | ORAL | 1 refills | Status: DC

## 2020-07-31 NOTE — Progress Notes (Signed)
Established Patient Office Visit  Subjective:  Patient ID: John Chen, male    DOB: [birth year]  Age: 49 y.o. MRN: [id number]  CC:  Chief Complaint  Patient presents with  . Pain    C/O neck pains x 1 month, stomach pains that come and go x 2 months becoming worse.     HPI Joseantonio Dittmar presents for follow-up of hypertension that is been well controlled with the chlorthalidone.  He reports a 2 to 20-monthhistory of pain in the right upper back and neck area that sometimes seems to radiate over to the other side.  No specific injury here.  Denies any radiation of pain into the upper extremities.  Denies weakness numbness or tingling.  He has had a new job after this started but says that the new job is actually easier than the old one.  He continues to lift weights and workout at home.  Also reports a history of left abdominal pain.  He does have some constipation alternating with loose stool.  Denies weight loss or night sweats.  Denies reflux or indigestion.  Denies blood in his stool or dark tarry stools.  He has tried no medicines for this.  Sometimes he worries about cancer.  Past Medical History:  Diagnosis Date  . Hypertension     No past surgical history on file.  Family History  Problem Relation Age of Onset  . Hypertension Mother   . Hypertension Father   . Asthma Sister   . Hypertension Sister   . Drug abuse Brother   . Early death Brother   . Heart attack Brother   . Hypertension Maternal Grandmother   . Asthma Sister   . Alcohol abuse Neg Hx   . Cancer Neg Hx   . Diabetes Neg Hx   . Hearing loss Neg Hx   . Heart disease Neg Hx   . Hyperlipidemia Neg Hx   . Kidney disease Neg Hx   . Stroke Neg Hx     Social History   Socioeconomic History  . Marital status: Married    Spouse name: Not on file  . Number of children: Not on file  . Years of education: Not on file  . Highest education level: Not on file  Occupational History  . Not on file  Tobacco Use    . Smoking status: Never Smoker  . Smokeless tobacco: Never Used  Vaping Use  . Vaping Use: Never used  Substance and Sexual Activity  . Alcohol use: Yes    Alcohol/week: 5.0 standard drinks    Types: 5 Glasses of wine per week  . Drug use: No  . Sexual activity: Yes  Other Topics Concern  . Not on file  Social History Narrative  . Not on file   Social Determinants of Health   Financial Resource Strain:   . Difficulty of Paying Living Expenses: Not on file  Food Insecurity:   . Worried About RCharity fundraiserin the Last Year: Not on file  . Ran Out of Food in the Last Year: Not on file  Transportation Needs:   . Lack of Transportation (Medical): Not on file  . Lack of Transportation (Non-Medical): Not on file  Physical Activity:   . Days of Exercise per Week: Not on file  . Minutes of Exercise per Session: Not on file  Stress:   . Feeling of Stress : Not on file  Social Connections:   . Frequency of Communication with  Friends and Family: Not on file  . Frequency of Social Gatherings with Friends and Family: Not on file  . Attends Religious Services: Not on file  . Active Member of Clubs or Organizations: Not on file  . Attends Archivist Meetings: Not on file  . Marital Status: Not on file  Intimate Partner Violence:   . Fear of Current or Ex-Partner: Not on file  . Emotionally Abused: Not on file  . Physically Abused: Not on file  . Sexually Abused: Not on file    Outpatient Medications Prior to Visit  Medication Sig Dispense Refill  . vitamin B-12 (CYANOCOBALAMIN) 500 MCG tablet Take by mouth.    . chlorthalidone (HYGROTON) 25 MG tablet Take 1 tablet (25 mg total) by mouth daily. 90 tablet 0  . Carbamide Peroxide-Saline (EAR WAX CLEANSING) 6.5 % KIT As directed per kit. (Patient not taking: Reported on 01/01/2020) 1 kit 2   No facility-administered medications prior to visit.    No Known Allergies  ROS Review of Systems  Constitutional: Negative  for diaphoresis, fatigue, fever and unexpected weight change.  HENT: Negative.   Eyes: Negative for photophobia and visual disturbance.  Respiratory: Negative.   Cardiovascular: Negative for chest pain and palpitations.  Gastrointestinal: Positive for abdominal pain and constipation. Negative for anal bleeding, blood in stool, nausea, rectal pain and vomiting.  Endocrine: Negative for polyphagia and polyuria.  Genitourinary: Negative for difficulty urinating, frequency and urgency.      Objective:    Physical Exam Vitals and nursing note reviewed.  Constitutional:      General: He is not in acute distress.    Appearance: Normal appearance. He is normal weight. He is not ill-appearing, toxic-appearing or diaphoretic.  HENT:     Head: Normocephalic and atraumatic.     Right Ear: Tympanic membrane, ear canal and external ear normal.     Left Ear: Tympanic membrane, ear canal and external ear normal.  Eyes:     General: No scleral icterus.       Right eye: No discharge.        Left eye: No discharge.     Conjunctiva/sclera: Conjunctivae normal.     Pupils: Pupils are equal, round, and reactive to light.  Cardiovascular:     Rate and Rhythm: Normal rate and regular rhythm.  Pulmonary:     Effort: Pulmonary effort is normal.     Breath sounds: Normal breath sounds.  Abdominal:     General: Abdomen is flat. Bowel sounds are normal. There is no distension.     Palpations: Abdomen is soft. There is no mass.     Tenderness: There is no abdominal tenderness. There is no guarding or rebound.     Hernia: A hernia is present. Hernia is present in the umbilical area. There is no hernia in the ventral area.  Genitourinary:    Prostate: Not enlarged, not tender and no nodules present.     Rectum: Guaiac result negative. No mass, tenderness, anal fissure, external hemorrhoid or internal hemorrhoid. Normal anal tone.  Musculoskeletal:     Cervical back: No rigidity or tenderness.     Right  lower leg: No edema.     Left lower leg: No edema.  Lymphadenopathy:     Cervical: No cervical adenopathy.  Skin:    General: Skin is warm and dry.  Neurological:     Mental Status: He is alert and oriented to person, place, and time.  Psychiatric:  Mood and Affect: Mood normal.        Behavior: Behavior normal.     BP 122/84   Pulse 66   Temp (!) 97 F (36.1 C) (Tympanic)   Ht _0  (1.753 m)   Wt 191 lb 6.4 oz (86.8 kg)   SpO2 97%   BMI 28.26 kg/m  Wt Readings from Last 3 Encounters:  07/31/20 191 lb 6.4 oz (86.8 kg)  02/26/20 187 lb (84.8 kg)  01/01/20 185 lb (83.9 kg)     Health Maintenance Due  Topic Date Due  . Hepatitis C Screening  Never done  . COLONOSCOPY  Never done    There are no preventive care reminders to display for this patient.  Lab Results  Component Value Date   TSH 1.81 04/10/2019   Lab Results  Component Value Date   WBC 4.8 01/01/2020   HGB 15.1 01/01/2020   HCT 45.1 01/01/2020   MCV 92.0 01/01/2020   PLT 220.0 01/01/2020   Lab Results  Component Value Date   NA 136 01/01/2020   K 4.1 01/01/2020   CO2 30 01/01/2020   GLUCOSE 92 01/01/2020   BUN 18 01/01/2020   CREATININE 1.27 01/01/2020   BILITOT 1.2 01/01/2020   ALKPHOS 47 01/01/2020   AST 29 01/01/2020   ALT 33 01/01/2020   PROT 7.9 01/01/2020   ALBUMIN 4.6 01/01/2020   CALCIUM 10.4 01/01/2020   GFR 72.58 01/01/2020   Lab Results  Component Value Date   CHOL 168 01/01/2020   Lab Results  Component Value Date   HDL 51.60 01/01/2020   Lab Results  Component Value Date   LDLCALC 94 01/01/2020   Lab Results  Component Value Date   TRIG 109.0 01/01/2020   Lab Results  Component Value Date   CHOLHDL 3 01/01/2020   No results found for: HGBA1C    Assessment & Plan:   Problem List Items Addressed This Visit      Cardiovascular and Mediastinum   Essential hypertension - Primary   Relevant Medications   chlorthalidone (HYGROTON) 25 MG tablet    Other Relevant Orders   CBC   Comprehensive metabolic panel     Musculoskeletal and Integument   Trapezius strain   Relevant Orders   Ambulatory referral to Sports Medicine   PSA     Other   Left upper quadrant abdominal pain   Relevant Orders   Ambulatory referral to Gastroenterology   Urinalysis, Routine w reflex microscopic   Amylase   CBC   Comprehensive metabolic panel    Other Visit Diagnoses    Healthcare maintenance          Meds ordered this encounter  Medications  . chlorthalidone (HYGROTON) 25 MG tablet    Sig: Take 1 tablet (25 mg total) by mouth daily.    Dispense:  90 tablet    Refill:  1    Follow-up: Return in about 3 months (around 10/31/2020), or if symptoms worsen or fail to improve.    Libby Maw, MD

## 2020-08-06 ENCOUNTER — Other Ambulatory Visit: Payer: Self-pay

## 2020-08-06 ENCOUNTER — Ambulatory Visit (INDEPENDENT_AMBULATORY_CARE_PROVIDER_SITE_OTHER): Payer: BC Managed Care – PPO | Admitting: Family Medicine

## 2020-08-06 DIAGNOSIS — M5412 Radiculopathy, cervical region: Secondary | ICD-10-CM | POA: Diagnosis not present

## 2020-08-06 MED ORDER — PREDNISONE 5 MG PO TABS: ORAL_TABLET | ORAL | 0 refills | Status: DC

## 2020-08-06 NOTE — Assessment & Plan Note (Signed)
Symptoms been ongoing for roughly 1 month.  Seems to have a radicular component with spasm of the trapezius as well. -Counseled on home exercise therapy and supportive care. -Prednisone. -Could consider trigger point injections or physical therapy or imaging.

## 2020-08-06 NOTE — Patient Instructions (Signed)
Nice to meet you Please try heat  Please try the medicine  Please try the exercises   Please send me a message in MyChart with any questions or updates.  Please see me back in 4 weeks.   --Dr. Raeford Razor

## 2020-08-06 NOTE — Progress Notes (Signed)
John Chen - 49 y.o. male MRN [id number]  Date of birth: Sep 14, 1971  SUBJECTIVE:  Including CC & ROS.  No chief complaint on file.   John Chen is a 49 y.o. male that is presenting with neck pain and right-sided arm pain and altered sensation.  Symptoms been ongoing for about 1 month.  Has not tried anything for the symptoms.  They seem to be staying the same..  Independent review of the cervical spine x-ray from 2014 shows degenerative changes at C5.  Review of Systems See HPI   HISTORY: Past Medical, Surgical, Social, and Family History Reviewed & Updated per EMR.   Pertinent Historical Findings include:  Past Medical History:  Diagnosis Date  . Hypertension     No past surgical history on file.  Family History  Problem Relation Age of Onset  . Hypertension Mother   . Hypertension Father   . Asthma Sister   . Hypertension Sister   . Drug abuse Brother   . Early death Brother   . Heart attack Brother   . Hypertension Maternal Grandmother   . Asthma Sister   . Alcohol abuse Neg Hx   . Cancer Neg Hx   . Diabetes Neg Hx   . Hearing loss Neg Hx   . Heart disease Neg Hx   . Hyperlipidemia Neg Hx   . Kidney disease Neg Hx   . Stroke Neg Hx     Social History   Socioeconomic History  . Marital status: Married    Spouse name: Not on file  . Number of children: Not on file  . Years of education: Not on file  . Highest education level: Not on file  Occupational History  . Not on file  Tobacco Use  . Smoking status: Never Smoker  . Smokeless tobacco: Never Used  Vaping Use  . Vaping Use: Never used  Substance and Sexual Activity  . Alcohol use: Yes    Alcohol/week: 5.0 standard drinks    Types: 5 Glasses of wine per week  . Drug use: No  . Sexual activity: Yes  Other Topics Concern  . Not on file  Social History Narrative  . Not on file   Social Determinants of Health   Financial Resource Strain:   . Difficulty of Paying Living Expenses: Not on file   Food Insecurity:   . Worried About Charity fundraiser in the Last Year: Not on file  . Ran Out of Food in the Last Year: Not on file  Transportation Needs:   . Lack of Transportation (Medical): Not on file  . Lack of Transportation (Non-Medical): Not on file  Physical Activity:   . Days of Exercise per Week: Not on file  . Minutes of Exercise per Session: Not on file  Stress:   . Feeling of Stress : Not on file  Social Connections:   . Frequency of Communication with Friends and Family: Not on file  . Frequency of Social Gatherings with Friends and Family: Not on file  . Attends Religious Services: Not on file  . Active Member of Clubs or Organizations: Not on file  . Attends Archivist Meetings: Not on file  . Marital Status: Not on file  Intimate Partner Violence:   . Fear of Current or Ex-Partner: Not on file  . Emotionally Abused: Not on file  . Physically Abused: Not on file  . Sexually Abused: Not on file     PHYSICAL EXAM:  VS: BP  127/68   Ht 5\' 9"  (1.753 m)   Wt 188 lb (85.3 kg)   BMI 27.76 kg/m  Physical Exam Gen: NAD, alert, cooperative with exam, well-appearing MSK:  Neck: Normal range of motion in flexion and extension. Normal lateral rotation. Normal strength resistance with shrug. Normal shoulder range of motion. Normal grip strength. Normal pincer grasp. Normal thumb extension. Neurovascular intact     ASSESSMENT & PLAN:   Cervical radiculopathy Symptoms been ongoing for roughly 1 month.  Seems to have a radicular component with spasm of the trapezius as well. -Counseled on home exercise therapy and supportive care. -Prednisone. -Could consider trigger point injections or physical therapy or imaging.

## 2020-08-07 ENCOUNTER — Other Ambulatory Visit: Payer: Self-pay

## 2020-08-07 NOTE — Addendum Note (Signed)
Addended by: Lynnea Ferrier on: 08/07/2020 04:48 PM   Modules accepted: Orders

## 2020-08-08 ENCOUNTER — Other Ambulatory Visit: Payer: BC Managed Care – PPO

## 2020-09-04 ENCOUNTER — Ambulatory Visit: Payer: BC Managed Care – PPO | Admitting: Family Medicine

## 2020-10-06 ENCOUNTER — Encounter: Payer: Self-pay | Admitting: Family Medicine

## 2020-11-04 ENCOUNTER — Encounter: Payer: BC Managed Care – PPO | Admitting: Family Medicine

## 2020-12-19 ENCOUNTER — Encounter: Payer: BC Managed Care – PPO | Admitting: Family Medicine

## 2021-01-06 ENCOUNTER — Other Ambulatory Visit: Payer: Self-pay

## 2021-01-07 ENCOUNTER — Ambulatory Visit (INDEPENDENT_AMBULATORY_CARE_PROVIDER_SITE_OTHER): Payer: BC Managed Care – PPO

## 2021-01-07 ENCOUNTER — Ambulatory Visit: Payer: BC Managed Care – PPO | Admitting: Family Medicine

## 2021-01-07 ENCOUNTER — Encounter: Payer: Self-pay | Admitting: Family Medicine

## 2021-01-07 VITALS — BP 122/84 | HR 61 | Temp 97.9°F | Ht 69.0 in | Wt 202.2 lb

## 2021-01-07 DIAGNOSIS — R1030 Lower abdominal pain, unspecified: Secondary | ICD-10-CM

## 2021-01-07 DIAGNOSIS — R109 Unspecified abdominal pain: Secondary | ICD-10-CM | POA: Diagnosis not present

## 2021-01-07 DIAGNOSIS — R140 Abdominal distension (gaseous): Secondary | ICD-10-CM

## 2021-01-07 NOTE — Progress Notes (Signed)
John Chen is a 50 y.o. male  Chief Complaint  Patient presents with  . Abdominal Pain    Pt c/o abd x 1 month, starting on rt side and marginating to the other side and back area.    HPI: John Chen is a 50 y.o. male patient of Dr. Ethelene Hal who complains of 1 mo h/o Rt lower abdominal pain that has persisted and spread to Lt side as well as back and upper legs. No injury or trauma. Pain waxes and wanes but never goes away. Pain does not wake pt up at night. Describes the pain as "a little bit of burn" and "crampy". He also notes abdominal bloating x 2 wks. Not associated with food, post-prandial. Pt denies diarrhea, constipation. Has 1-2 BM per day, normal volume and consistency. He did have 2-3 days about 1-2wks ago where is felt like he had to have a BM but would strain and was not able to go. Pt endorses mild, intermittent nausea ("not very often") but no vomiting. Normal appetite. No increased belching or flatus. No blood in stool and no melena.  Diet - average diet  No urinary symptoms. No fever, chills, myalgias. No weight loss.  Pt has taken ibuprofen PRN but "not often" and feels it "dulls" the pain.  Pt had colonoscopy in 01/2025 with [facility] - normal and f/u was recommended in 10 years.  He has had the same type of pain 2 other times in the past 6 years.  No fam h/o CRC, IBD, celiac.   Wife would like pt to see Dr. Meriel Pica [facility]) if he is referred to GI.  Past Medical History:  Diagnosis Date  . Hypertension     History reviewed. No pertinent surgical history.  Social History   Socioeconomic History  . Marital status: Married    Spouse name: Not on file  . Number of children: Not on file  . Years of education: Not on file  . Highest education level: Not on file  Occupational History  . Not on file  Tobacco Use  . Smoking status: Never Smoker  . Smokeless tobacco: Never Used  Vaping Use  . Vaping Use: Never used  Substance and Sexual Activity   . Alcohol use: Yes    Alcohol/week: 5.0 standard drinks    Types: 5 Glasses of wine per week  . Drug use: No  . Sexual activity: Yes  Other Topics Concern  . Not on file  Social History Narrative  . Not on file   Social Determinants of Health   Financial Resource Strain: Not on file  Food Insecurity: Not on file  Transportation Needs: Not on file  Physical Activity: Not on file  Stress: Not on file  Social Connections: Not on file  Intimate Partner Violence: Not on file    Family History  Problem Relation Age of Onset  . Hypertension Mother   . Hypertension Father   . Asthma Sister   . Hypertension Sister   . Drug abuse Brother   . Early death Brother   . Heart attack Brother   . Hypertension Maternal Grandmother   . Asthma Sister   . Alcohol abuse Neg Hx   . Cancer Neg Hx   . Diabetes Neg Hx   . Hearing loss Neg Hx   . Heart disease Neg Hx   . Hyperlipidemia Neg Hx   . Kidney disease Neg Hx   . Stroke Neg Hx      Immunization  History  Administered Date(s) Administered  . Moderna Sars-Covid-2 Vaccination 02/22/2020, 03/22/2020  . Tdap 02/14/2013, 04/14/2018    Outpatient Encounter Medications as of 01/07/2021  Medication Sig  . chlorthalidone (HYGROTON) 25 MG tablet Take 1 tablet (25 mg total) by mouth daily.  . predniSONE (DELTASONE) 5 MG tablet Take 6 pills for first day, 5 pills second day, 4 pills third day, 3 pills fourth day, 2 pills the fifth day, and 1 pill sixth day. (Patient not taking: Reported on 01/07/2021)  . vitamin B-12 (CYANOCOBALAMIN) 500 MCG tablet Take by mouth. (Patient not taking: Reported on 01/07/2021)  . [DISCONTINUED] Carbamide Peroxide-Saline (EAR WAX CLEANSING) 6.5 % KIT As directed per kit. (Patient not taking: Reported on 01/01/2020)   No facility-administered encounter medications on file as of 01/07/2021.     ROS: Pertinent positives and negatives noted in HPI. Remainder of ROS non-contributory  No Known Allergies  BP 122/84  (BP Location: Left Arm, Patient Position: Sitting, Cuff Size: Large)   Pulse 61   Temp 97.9 F (36.6 C) (Temporal)   Ht _0  (1.753 m)   Wt 202 lb 3.2 oz (91.7 kg)   SpO2 97%   BMI 29.86 kg/m   Wt Readings from Last 3 Encounters:  01/07/21 202 lb 3.2 oz (91.7 kg)  08/06/20 188 lb (85.3 kg)  07/31/20 191 lb 6.4 oz (86.8 kg)   Temp Readings from Last 3 Encounters:  01/07/21 97.9 F (36.6 C) (Temporal)  07/31/20 (!) 97 F (36.1 C) (Tympanic)  02/26/20 (!) 97.5 F (36.4 C) (Tympanic)   BP Readings from Last 3 Encounters:  01/07/21 122/84  08/06/20 127/68  07/31/20 122/84   Pulse Readings from Last 3 Encounters:  01/07/21 61  07/31/20 66  02/26/20 72     Physical Exam Constitutional:      General: He is not in acute distress.    Appearance: He is well-developed. He is not ill-appearing.  Abdominal:     General: Bowel sounds are normal. There is distension.     Palpations: Abdomen is soft. There is no hepatomegaly, splenomegaly or mass.     Tenderness: There is abdominal tenderness in the right lower quadrant, suprapubic area and left lower quadrant. There is no right CVA tenderness, left CVA tenderness, guarding or rebound. Negative signs include Murphy's sign and McBurney's sign.     Hernia: A hernia is present. Hernia is present in the umbilical area (not new or changed).  Neurological:     Mental Status: He is alert and oriented to person, place, and time.  Psychiatric:        Mood and Affect: Mood normal.        Behavior: Behavior normal.      A/P:  1. Lower abdominal pain 2. Abdominal bloating - pt has had 2 previous episodes very similar to this in the past 5-6 years - ? Constipation vs diverticulitis vs IBS  - normal colonoscopy with [facility] in 01/2015 - due in 01/2025 - Comprehensive metabolic panel - CBC w/Diff - Lipase - DG Abd 2 Views - will contact pt via MyChart w/ results and discuss further eval & treatment   This visit occurred during the  SARS-CoV-2 public health emergency.  Safety protocols were in place, including screening questions prior to the visit, additional usage of staff PPE, and extensive cleaning of exam room while observing appropriate contact time as indicated for disinfecting solutions.

## 2021-01-08 ENCOUNTER — Encounter: Payer: Self-pay | Admitting: Family Medicine

## 2021-01-08 ENCOUNTER — Other Ambulatory Visit: Payer: Self-pay | Admitting: Family Medicine

## 2021-01-08 DIAGNOSIS — R7989 Other specified abnormal findings of blood chemistry: Secondary | ICD-10-CM

## 2021-01-08 LAB — CBC WITH DIFFERENTIAL/PLATELET
Basophils Absolute: 0.1 10*3/uL (ref 0.0–0.2)
Basos: 1 %
EOS (ABSOLUTE): 0.1 10*3/uL (ref 0.0–0.4)
Eos: 2 %
Hematocrit: 40.6 % (ref 37.5–51.0)
Hemoglobin: 14 g/dL (ref 13.0–17.7)
Immature Grans (Abs): 0.1 10*3/uL (ref 0.0–0.1)
Immature Granulocytes: 1 %
Lymphocytes Absolute: 1.8 10*3/uL (ref 0.7–3.1)
Lymphs: 34 %
MCH: 31 pg (ref 26.6–33.0)
MCHC: 34.5 g/dL (ref 31.5–35.7)
MCV: 90 fL (ref 79–97)
Monocytes Absolute: 0.6 10*3/uL (ref 0.1–0.9)
Monocytes: 11 %
Neutrophils Absolute: 2.7 10*3/uL (ref 1.4–7.0)
Neutrophils: 51 %
Platelets: 200 10*3/uL (ref 150–450)
RBC: 4.52 x10E6/uL (ref 4.14–5.80)
RDW: 12.2 % (ref 11.6–15.4)
WBC: 5.4 10*3/uL (ref 3.4–10.8)

## 2021-01-08 LAB — COMPREHENSIVE METABOLIC PANEL
ALT: 46 IU/L — ABNORMAL HIGH (ref 0–44)
AST: 46 IU/L — ABNORMAL HIGH (ref 0–40)
Albumin/Globulin Ratio: 2 (ref 1.2–2.2)
Albumin: 5.1 g/dL — ABNORMAL HIGH (ref 3.8–4.9)
Alkaline Phosphatase: 60 IU/L (ref 44–121)
BUN/Creatinine Ratio: 18 (ref 9–20)
BUN: 21 mg/dL (ref 6–24)
Bilirubin Total: 0.5 mg/dL (ref 0.0–1.2)
CO2: 26 mmol/L (ref 20–29)
Calcium: 10.4 mg/dL — ABNORMAL HIGH (ref 8.7–10.2)
Chloride: 100 mmol/L (ref 96–106)
Creatinine, Ser: 1.18 mg/dL (ref 0.76–1.27)
Globulin, Total: 2.5 g/dL (ref 1.5–4.5)
Glucose: 96 mg/dL (ref 65–99)
Potassium: 4.1 mmol/L (ref 3.5–5.2)
Sodium: 142 mmol/L (ref 134–144)
Total Protein: 7.6 g/dL (ref 6.0–8.5)
eGFR: 75 mL/min/{1.73_m2} (ref >59)

## 2021-01-08 LAB — LIPASE: Lipase: 41 U/L (ref 13–78)

## 2021-01-11 ENCOUNTER — Encounter: Payer: Self-pay | Admitting: Family Medicine

## 2021-01-11 ENCOUNTER — Other Ambulatory Visit: Payer: Self-pay

## 2021-01-11 ENCOUNTER — Emergency Department (HOSPITAL_COMMUNITY): Payer: BC Managed Care – PPO

## 2021-01-11 ENCOUNTER — Emergency Department (HOSPITAL_COMMUNITY)
Admission: EM | Admit: 2021-01-11 | Discharge: 2021-01-11 | Disposition: A | Discharge status: Home / Self Care | Payer: BC Managed Care – PPO | Attending: Emergency Medicine | Admitting: Emergency Medicine

## 2021-01-11 ENCOUNTER — Encounter (HOSPITAL_COMMUNITY): Payer: Self-pay

## 2021-01-11 DIAGNOSIS — I10 Essential (primary) hypertension: Secondary | ICD-10-CM | POA: Diagnosis not present

## 2021-01-11 DIAGNOSIS — K429 Umbilical hernia without obstruction or gangrene: Secondary | ICD-10-CM | POA: Diagnosis not present

## 2021-01-11 DIAGNOSIS — M549 Dorsalgia, unspecified: Secondary | ICD-10-CM | POA: Diagnosis not present

## 2021-01-11 DIAGNOSIS — R1032 Left lower quadrant pain: Secondary | ICD-10-CM

## 2021-01-11 DIAGNOSIS — M5442 Lumbago with sciatica, left side: Secondary | ICD-10-CM | POA: Insufficient documentation

## 2021-01-11 DIAGNOSIS — K5900 Constipation, unspecified: Secondary | ICD-10-CM | POA: Insufficient documentation

## 2021-01-11 DIAGNOSIS — M545 Low back pain, unspecified: Secondary | ICD-10-CM | POA: Diagnosis not present

## 2021-01-11 DIAGNOSIS — M5430 Sciatica, unspecified side: Secondary | ICD-10-CM

## 2021-01-11 DIAGNOSIS — R1084 Generalized abdominal pain: Secondary | ICD-10-CM | POA: Diagnosis not present

## 2021-01-11 LAB — COMPREHENSIVE METABOLIC PANEL
ALT: 43 U/L (ref 0–44)
AST: 54 U/L — ABNORMAL HIGH (ref 15–41)
Albumin: 4.7 g/dL (ref 3.5–5.0)
Alkaline Phosphatase: 40 U/L (ref 38–126)
Anion gap: 12 (ref 5–15)
BUN: 15 mg/dL (ref 6–20)
CO2: 22 mmol/L (ref 22–32)
Calcium: 10.3 mg/dL (ref 8.9–10.3)
Chloride: 103 mmol/L (ref 98–111)
Creatinine, Ser: 1.27 mg/dL — ABNORMAL HIGH (ref 0.61–1.24)
GFR, Estimated: >60 mL/min (ref >60)
Glucose, Bld: 99 mg/dL (ref 70–99)
Potassium: 3.9 mmol/L (ref 3.5–5.1)
Sodium: 137 mmol/L (ref 135–145)
Total Bilirubin: 2 mg/dL — ABNORMAL HIGH (ref 0.3–1.2)
Total Protein: 8.1 g/dL (ref 6.5–8.1)

## 2021-01-11 LAB — CBC WITH DIFFERENTIAL/PLATELET
Abs Immature Granulocytes: 0.09 10*3/uL — ABNORMAL HIGH (ref 0.00–0.07)
Basophils Absolute: 0.1 10*3/uL (ref 0.0–0.1)
Basophils Relative: 1 %
Eosinophils Absolute: 0.1 10*3/uL (ref 0.0–0.5)
Eosinophils Relative: 1 %
HCT: 45.2 % (ref 39.0–52.0)
Hemoglobin: 15.7 g/dL (ref 13.0–17.0)
Immature Granulocytes: 2 %
Lymphocytes Relative: 31 %
Lymphs Abs: 1.6 10*3/uL (ref 0.7–4.0)
MCH: 30.5 pg (ref 26.0–34.0)
MCHC: 34.7 g/dL (ref 30.0–36.0)
MCV: 87.9 fL (ref 80.0–100.0)
Monocytes Absolute: 0.5 10*3/uL (ref 0.1–1.0)
Monocytes Relative: 9 %
Neutro Abs: 2.9 10*3/uL (ref 1.7–7.7)
Neutrophils Relative %: 56 %
Platelets: 217 10*3/uL (ref 150–400)
RBC: 5.14 MIL/uL (ref 4.22–5.81)
RDW: 11.4 % — ABNORMAL LOW (ref 11.5–15.5)
WBC: 5.2 10*3/uL (ref 4.0–10.5)
nRBC: 0 % (ref 0.0–0.2)

## 2021-01-11 LAB — LIPASE, BLOOD: Lipase: 34 U/L (ref 11–51)

## 2021-01-11 MED ORDER — HYDROCODONE-ACETAMINOPHEN 5-325 MG PO TABS: 1.0000 | ORAL_TABLET | ORAL | 0 refills | Status: DC | PRN

## 2021-01-11 MED ORDER — ONDANSETRON HCL 4 MG/2ML IJ SOLN
4.0000 mg | Freq: Once | INTRAMUSCULAR | Status: AC
  Administered 2021-01-11: 4 mg via INTRAVENOUS
  Filled 2021-01-11: qty 2

## 2021-01-11 MED ORDER — PREDNISONE 50 MG PO TABS: 50.0000 mg | ORAL_TABLET | Freq: Every day | ORAL | 0 refills | Status: AC

## 2021-01-11 MED ORDER — CYCLOBENZAPRINE HCL 10 MG PO TABS: 10.0000 mg | ORAL_TABLET | Freq: Two times a day (BID) | ORAL | 0 refills | Status: DC | PRN

## 2021-01-11 MED ORDER — IOHEXOL 300 MG/ML  SOLN
100.0000 mL | Freq: Once | INTRAMUSCULAR | Status: AC | PRN
  Administered 2021-01-11: 100 mL via INTRAVENOUS

## 2021-01-11 MED ORDER — LIDOCAINE 5 % EX PTCH
1.0000 | MEDICATED_PATCH | CUTANEOUS | Status: DC
  Administered 2021-01-11: 1 via TRANSDERMAL
  Filled 2021-01-11: qty 1

## 2021-01-11 MED ORDER — CYCLOBENZAPRINE HCL 10 MG PO TABS
5.0000 mg | ORAL_TABLET | Freq: Once | ORAL | Status: AC
  Administered 2021-01-11: 5 mg via ORAL
  Filled 2021-01-11: qty 1

## 2021-01-11 MED ORDER — SODIUM CHLORIDE 0.9 % IV BOLUS
1000.0000 mL | Freq: Once | INTRAVENOUS | Status: AC
  Administered 2021-01-11: 1000 mL via INTRAVENOUS

## 2021-01-11 MED ORDER — LIDOCAINE 5 % EX PTCH: 1.0000 | MEDICATED_PATCH | CUTANEOUS | 0 refills | Status: DC

## 2021-01-11 MED ORDER — MORPHINE SULFATE (PF) 4 MG/ML IV SOLN
4.0000 mg | Freq: Once | INTRAVENOUS | Status: AC
Start: 2021-01-11 — End: 2021-01-11
  Administered 2021-01-11: 4 mg via INTRAVENOUS
  Filled 2021-01-11: qty 1

## 2021-01-11 MED ORDER — HYDROMORPHONE HCL 1 MG/ML IJ SOLN
1.0000 mg | Freq: Once | INTRAMUSCULAR | Status: AC
  Administered 2021-01-11: 1 mg via INTRAVENOUS
  Filled 2021-01-11: qty 1

## 2021-01-11 MED ORDER — POLYETHYLENE GLYCOL 3350 17 GM/SCOOP PO POWD: 1.0000 | Freq: Once | ORAL | 0 refills | Status: AC

## 2021-01-11 MED ORDER — PREDNISONE 20 MG PO TABS
60.0000 mg | ORAL_TABLET | Freq: Once | ORAL | Status: AC
  Administered 2021-01-11: 60 mg via ORAL
  Filled 2021-01-11: qty 3

## 2021-01-11 NOTE — ED Notes (Signed)
Patient provided with urinal and made aware urine sample is needed. 

## 2021-01-11 NOTE — Discharge Instructions (Addendum)
It was a pleasure taking care of you here in the emergency department.  Your labs today were reassuring.  You did have a mild elevation in your liver function test which was consistent with your primary care providers evaluation.  Your CT scan did not show any evidence of any gallbladder or liver issues which is reassuring.    As discussed in room I would recommend a MiraLAX regimen.  May do 2 capfuls in 8 ounces of liquid.  May repeat every hour as needed to produce a bowel movement.  Do not take more than 8 capfuls daily  After successful BM may do 1 capfuls twice daily.  He may benefit from adding a fiber supplement to the diet however make sure you are increasing your water since does not make her constipation worse.  Your CT scan of your lumbar spine did not show any evidence of acute abnormalities.  As discussed only an MR can show a herniated disc or nerve impingement. Your exam today is consistent with sciatica.  I prescribed you pain medicine, anti-inflammatories, lidocaine patches and muscle relaxers.  Do not drive or operate heavy machinery while taking this medication.  Follow up with your primary care provider this week for reevaluation.  Return for any new or worsening symptoms such as numbness or tingling to your groin, nausea, inability to walk, chest pain

## 2021-01-11 NOTE — ED Triage Notes (Signed)
Patient bib gems. Gems states patient has had back pain for a month. Patient went to pcp last week, was told he has constipation. Patient went to gym on Friday and has had more severe  lumbar pain since. Pain rated 10/10.

## 2021-01-11 NOTE — ED Provider Notes (Signed)
[facility] DEPT Provider Note   CSN: [id number] Arrival date & time: 01/11/21  1020     History Chief Complaint  Patient presents with  . Back Pain    John Chen is a 50 y.o. male with past history significant for hypertension who presents for evaluation of left-sided back pain into left lower abdomen.  Has also had some intermittent left lower quadrant abdominal pain.  Was seen by PCP for this.  Performed x-ray which showed constipation.  Did note some elevated LFTs at that time.  Patient denies any right-sided abdominal pain, pain increased with food intake.  States he had a small bowel movement 2 days ago.  He is passing flatulence.  No prior abdominal surgeries.  Pain worse with movement.  Pain will intermittently shoot down his left lower extremity.  Has spasms.  Try taking Aleve without helping.  Described pain 10/10.  Denies history of IV drug use, bowel or bladder incontinence, saddle paresthesia, no malignancy.  Has had some degree of pain over the last month however worsening over the last week. Tried to "work it out" at [employer] however subsequently made pain worse.  No history of prior abdominal surgeries or lumbar surgeries.  No fever, chills, nausea, vomiting, chest pain, shortness of breath, lightheadedness, dizziness, paresthesias, weakness, hematuria, difficulty urinating.  No history of stones, known AAA. Does do heavy lifting.  History of obtained from patient and past medical records.  No interpreter used  HPI     Past Medical History:  Diagnosis Date  . Hypertension     Patient Active Problem List   Diagnosis Date Noted  . Cervical radiculopathy 07/31/2020  . Left upper quadrant abdominal pain 07/31/2020  . Contusion of right foot 02/26/2020  . Laceration of right foot 02/26/2020  . Atypical nevi 01/01/2020  . Excessive cerumen in left ear canal 07/12/2019  . SOB (shortness of breath) 04/10/2019  . Essential hypertension  12/29/2018  . Olecranon bursitis of right elbow 12/29/2018  . Oral mucosal lesion 02/14/2013  . Fatigue 08/24/2012  . Health care maintenance 08/03/2012    No past surgical history on file.     Family History  Problem Relation Age of Onset  . Hypertension Mother   . Hypertension Father   . Asthma Sister   . Hypertension Sister   . Drug abuse Brother   . Early death Brother   . Heart attack Brother   . Hypertension Maternal Grandmother   . Asthma Sister   . Alcohol abuse Neg Hx   . Cancer Neg Hx   . Diabetes Neg Hx   . Hearing loss Neg Hx   . Heart disease Neg Hx   . Hyperlipidemia Neg Hx   . Kidney disease Neg Hx   . Stroke Neg Hx     Social History   Tobacco Use  . Smoking status: Never Smoker  . Smokeless tobacco: Never Used  Vaping Use  . Vaping Use: Never used  Substance Use Topics  . Alcohol use: Yes    Alcohol/week: 5.0 standard drinks    Types: 5 Glasses of wine per week  . Drug use: No    Home Medications Prior to Admission medications   Medication Sig Start Date End Date Taking? Authorizing Provider  cyclobenzaprine (FLEXERIL) 10 MG tablet Take 1 tablet (10 mg total) by mouth 2 (two) times daily as needed for muscle spasms. 01/11/21  Yes Henderly, Britni A, PA-C  HYDROcodone-acetaminophen (NORCO/VICODIN) 5-325 MG tablet Take 1  tablet by mouth every 4 (four) hours as needed. 01/11/21  Yes Henderly, Britni A, PA-C  lidocaine (LIDODERM) 5 % Place 1 patch onto the skin daily. Remove & Discard patch within 12 hours or as directed by MD 01/11/21  Yes Henderly, Britni A, PA-C  polyethylene glycol powder (GLYCOLAX/MIRALAX) 17 GM/SCOOP powder Take 255 g by mouth once for 1 dose. 01/11/21 01/11/21 Yes Henderly, Britni A, PA-C  predniSONE (DELTASONE) 50 MG tablet Take 1 tablet (50 mg total) by mouth daily for 5 days. 01/11/21 01/16/21 Yes Henderly, Britni A, PA-C  chlorthalidone (HYGROTON) 25 MG tablet Take 1 tablet (25 mg total) by mouth daily. 07/31/20   Libby Maw, MD    Allergies    Patient has no known allergies.  Review of Systems   Review of Systems  Constitutional: Negative.   HENT: Negative.   Respiratory: Negative.   Cardiovascular: Negative.   Gastrointestinal: Positive for abdominal pain and constipation. Negative for abdominal distention, anal bleeding, blood in stool, diarrhea, nausea, rectal pain and vomiting.  Genitourinary: Negative.   Musculoskeletal: Positive for back pain and gait problem (Pain with walking). Negative for arthralgias, joint swelling, myalgias, neck pain and neck stiffness.  Skin: Negative.   Neurological: Negative for dizziness, weakness, light-headedness, numbness and headaches.  All other systems reviewed and are negative.   Physical Exam Updated Vital Signs BP 137/85   Pulse 67   Temp 98.3 F (36.8 C) (Oral)   Resp 11   Ht 5\' 9"  (1.753 m)   Wt 91.7 kg   SpO2 96%   BMI 29.86 kg/m   Physical Exam Vitals and nursing note reviewed.  Constitutional:      General: He is not in acute distress.    Appearance: He is well-developed. He is not ill-appearing, toxic-appearing or diaphoretic.  HENT:     Head: Normocephalic and atraumatic.     Nose: Nose normal.     Mouth/Throat:     Mouth: Mucous membranes are moist.  Eyes:     Pupils: Pupils are equal, round, and reactive to light.  Cardiovascular:     Rate and Rhythm: Normal rate and regular rhythm.     Pulses: Normal pulses.          Dorsalis pedis pulses are 2+ on the right side and 2+ on the left side.       Posterior tibial pulses are 2+ on the right side and 2+ on the left side.     Heart sounds: Normal heart sounds.  Pulmonary:     Effort: Pulmonary effort is normal. No respiratory distress.     Breath sounds: Normal breath sounds.     Comments: Clear to auscultation bilateral wheeze, rhonchi rales. Abdominal:     General: There is no distension.     Palpations: Abdomen is soft. There is no mass.     Tenderness: There is no right  CVA tenderness, left CVA tenderness or guarding.     Comments: Mild diffuse tenderness to his lower abdomen however no rebound or guarding  Musculoskeletal:     Cervical back: Normal, normal range of motion and neck supple.     Thoracic back: Normal.     Lumbar back: Spasms, tenderness and bony tenderness present. No swelling, edema, deformity, signs of trauma or lacerations. Decreased range of motion. Positive right straight leg raise test. Negative left straight leg raise test. No scoliosis.       Back:     Right hip: Normal.  Left hip: Normal.     Right knee: Normal.     Left knee: Normal.     Comments: Tenderness to left lumbar region as well as left paraspinal region.  Tenderness to left SI, piriformis.  Palpable spasm to left gluteus.  Nontender cervical, thoracic region.  Positive straight leg raise on left at 45 degrees.  No bony tenderness to lower extremities  Skin:    General: Skin is warm and dry.     Capillary Refill: Capillary refill takes less than 2 seconds.     Comments: No edema, erythema or warmth.  No fluctuance or induration  Neurological:     General: No focal deficit present.     Mental Status: He is alert.     Sensory: Sensation is intact.     Motor: Motor function is intact.     Deep Tendon Reflexes: Reflexes are normal and symmetric.     Comments: Cranial nerves 2-12 gross intact Intact sensation      ED Results / Procedures / Treatments   Labs (all labs ordered are listed, but only abnormal results are displayed) Labs Reviewed  CBC WITH DIFFERENTIAL/PLATELET - Abnormal; Notable for the following components:      Result Value   RDW 11.4 (*)    Abs Immature Granulocytes 0.09 (*)    All other components within normal limits  COMPREHENSIVE METABOLIC PANEL - Abnormal; Notable for the following components:   Creatinine, Ser 1.27 (*)    AST 54 (*)    Total Bilirubin 2.0 (*)    All other components within normal limits  LIPASE, BLOOD  URINALYSIS,  ROUTINE W REFLEX MICROSCOPIC    EKG None  Radiology CT Lumbar Spine Wo Contrast  Result Date: 01/11/2021 CLINICAL DATA:  Low back pain for several weeks which worsened and began to into the left leg with twisting since 01/09/2021. Symptoms began after going to the gym. No specific injury. EXAM: CT LUMBAR SPINE WITHOUT CONTRAST TECHNIQUE: Multidetector CT imaging of the lumbar spine was performed without intravenous contrast administration. Multiplanar CT image reconstructions were also generated. COMPARISON:  None. FINDINGS: Segmentation: Standard. Alignment: Normal. Vertebrae: No fracture or focal pathologic process. Paraspinal and other soft tissues: See report of dedicated abdomen and pelvis CT scan today. Aortic atherosclerosis noted. Disc levels: Intervertebral disc space height is maintained at all levels. No central canal or foraminal stenosis is identified. IMPRESSION: Negative lumbar spine CT. Electronically Signed   By: Inge Rise M.D.   On: 01/11/2021 13:34   CT Abdomen Pelvis W Contrast  Result Date: 01/11/2021 CLINICAL DATA:  Low back pain for several weeks which worsened and began to into the left leg with twisting since 01/09/2021 after going to the gym. No specific injury. EXAM: CT ABDOMEN AND PELVIS WITH CONTRAST TECHNIQUE: Multidetector CT imaging of the abdomen and pelvis was performed using the standard protocol following bolus administration of intravenous contrast. CONTRAST:  100 mL OMNIPAQUE IOHEXOL 300 MG/ML  SOLN COMPARISON:  CT chest, abdomen and pelvis 03/07/2008. FINDINGS: Lower chest: Lung bases clear.  No pleural or pericardial effusion. Hepatobiliary: No focal liver abnormality is seen. No gallstones, gallbladder wall thickening, or biliary dilatation. Pancreas: Unremarkable. No pancreatic ductal dilatation or surrounding inflammatory changes. Spleen: Normal in size without focal abnormality. Adrenals/Urinary Tract: Adrenal glands are unremarkable. Kidneys are  normal, without renal calculi, focal lesion, or hydronephrosis. Bladder is unremarkable. Stomach/Bowel: Stomach is within normal limits. Appendix appears normal. No evidence of bowel wall thickening, distention, or inflammatory changes. Vascular/Lymphatic:  No significant vascular findings are present. No enlarged abdominal or pelvic lymph nodes. Reproductive: Prostate is unremarkable. Other: Fat containing umbilical hernia is noted. Musculoskeletal: No acute or focal abnormality. IMPRESSION: No acute abnormality abdomen or pelvis. No finding to explain the patient's symptoms. Fat containing umbilical hernia. Electronically Signed   By: Inge Rise M.D.   On: 01/11/2021 13:39    Procedures Procedures   Medications Ordered in ED Medications  lidocaine (LIDODERM) 5 % 1 patch (1 patch Transdermal Patch Applied 01/11/21 1204)  morphine 4 MG/ML injection 4 mg (4 mg Intravenous Given 01/11/21 1152)  predniSONE (DELTASONE) tablet 60 mg (60 mg Oral Given 01/11/21 1154)  ondansetron (ZOFRAN) injection 4 mg (4 mg Intravenous Given 01/11/21 1151)  cyclobenzaprine (FLEXERIL) tablet 5 mg (5 mg Oral Given 01/11/21 1155)  sodium chloride 0.9 % bolus 1,000 mL (0 mLs Intravenous Stopped 01/11/21 1436)  iohexol (OMNIPAQUE) 300 MG/ML solution 100 mL (100 mLs Intravenous Contrast Given 01/11/21 1307)  HYDROmorphone (DILAUDID) injection 1 mg (1 mg Intravenous Given 01/11/21 1436)    ED Course  I have reviewed the triage vital signs and the nursing notes.  Pertinent labs & imaging results that were available during my care of the patient were reviewed by me and considered in my medical decision making (see chart for details).  50 year old here for evaluation of left-sided abdominal pain, left lumbar pain.  Does have some radicular symptoms.  He is afebrile, nonseptic, non-ill-appearing.  Some degree of pain over the last month however worsening over the last week.  Has been advised by PCP who thought this was due to  constipation.  Patient abdominal pain 2 days ago.  No melena or bright blood per rectum.  No midline pulsatile abdominal mass rating to back.  No chest pain, shortness of breath.  Heart and lungs clear.  Does have some very minimal tenderness to his left abdomen however question if this is radiation of his back pain.  No for history of AAA, dissection.  He is neurovascularly intact.  Has positive straight leg raise on left.  Unable to reproduce his pain to his left lumbar region, left paraspinal as well as left glutes.  Plan on pain control, labs, imaging and reassess  Labs and imaging personally reviewed and interpreted:  CBC without leukocytosis Metabolic panel with creatinine 1.27, AST 54, T bili 2.0, elevation consistent with PCPs prior labs however small increase in bili.  Right upper quadrant nontender. Low suspicion for cholecystitis, choledocholithiasis or cholangitis Lipase 30  Patient reassessed. Pain improved her is requesting additional pain medication.  Discussed labs and imaging with patient and family in the room.  Abdominal pain likely due to constipation.  Low suspicion for acute bacterial infectious process, AAA, dissection, stone, obstructive process. Discussed MiraLAX regimen at home and close PCP follow up  Back pain likely due to sciatica based off exam.  No red flag symptoms, low suspicion for cauda equina, discitis, osteomyelitis, transverse myelitis, rhabdomyolysis, myositis.  No overlying skin changes to his lower extremities to indicate infectious process. NV intact. Equal pulses.  Low suspicion for ischemia, VTE. Ambulatory.  We will give another round of pain medicine prior to DC home.  Discussed strict return precautions, close follow-up with PCP.  DC home with symptomatic management.  I did discuss with patient opiate medication for his back pain which could possibly worsen his constipation.  The patient has been appropriately medically screened and/or stabilized in the  ED. I have low suspicion for any other emergent  medical condition which would require further screening, evaluation or treatment in the ED or require inpatient management.  Patient is hemodynamically stable and in no acute distress.  Patient able to ambulate in department prior to ED.  Evaluation does not show acute pathology that would require ongoing or additional emergent interventions while in the emergency department or further inpatient treatment.  I have discussed the diagnosis with the patient and answered all questions.  Pain is been managed while in the emergency department and patient has no further complaints prior to discharge.  Patient is comfortable with plan discussed in room and is stable for discharge at this time.  I have discussed strict return precautions for returning to the emergency department.  Patient was encouraged to follow-up with PCP/specialist refer to at discharge.    MDM Rules/Calculators/A&P                           Final Clinical Impression(s) / ED Diagnoses Final diagnoses:  Acute left-sided low back pain with left-sided sciatica  LLQ pain  Constipation, unspecified constipation type    Rx / DC Orders ED Discharge Orders         Ordered    polyethylene glycol powder (GLYCOLAX/MIRALAX) 17 GM/SCOOP powder   Once        01/11/21 1449    HYDROcodone-acetaminophen (NORCO/VICODIN) 5-325 MG tablet  Every 4 hours PRN        01/11/21 1449    cyclobenzaprine (FLEXERIL) 10 MG tablet  2 times daily PRN        01/11/21 1449    lidocaine (LIDODERM) 5 %  Every 24 hours        01/11/21 1449    predniSONE (DELTASONE) 50 MG tablet  Daily        01/11/21 1449           Henderly, Britni A, PA-C 01/11/21 1505    Little, Wenda Overland, MD 01/11/21 1527

## 2021-01-12 ENCOUNTER — Telehealth: Payer: Self-pay | Admitting: Family Medicine

## 2021-01-12 DIAGNOSIS — M5430 Sciatica, unspecified side: Secondary | ICD-10-CM

## 2021-01-12 NOTE — Telephone Encounter (Signed)
Pt was seen on 01/07/21 for Lower abdominal pain by Dr. Loletha Grayer. He went by ambulance this past Sunday 01/11/21 to the hospital-they think it's a pinched nerve. He is wanting a MRI order placed to figure out if this is the issue. Please advise pt at [phone].

## 2021-01-13 NOTE — Telephone Encounter (Signed)
Please see message and advise.  Thank you. ° °

## 2021-01-14 NOTE — Addendum Note (Signed)
Addended by: Jon Billings on: 01/14/2021 12:40 PM   Modules accepted: Orders

## 2021-01-14 NOTE — Telephone Encounter (Signed)
CT of lumbar spine was negative for stenosis. Needs to RTC or can refer to sports medicine for eval of sciatica.

## 2021-01-16 NOTE — Telephone Encounter (Signed)
I sent referral the other day but also called and lvm giving pt the phone number

## 2021-01-27 ENCOUNTER — Ambulatory Visit: Payer: BC Managed Care – PPO | Admitting: Family Medicine

## 2021-01-30 ENCOUNTER — Other Ambulatory Visit: Payer: Self-pay

## 2021-02-02 ENCOUNTER — Encounter: Payer: BC Managed Care – PPO | Admitting: Family Medicine

## 2021-03-05 ENCOUNTER — Other Ambulatory Visit: Payer: Self-pay

## 2021-03-05 ENCOUNTER — Encounter: Payer: Self-pay | Admitting: Family Medicine

## 2021-03-05 ENCOUNTER — Ambulatory Visit (INDEPENDENT_AMBULATORY_CARE_PROVIDER_SITE_OTHER): Payer: BC Managed Care – PPO | Admitting: Family Medicine

## 2021-03-05 VITALS — BP 122/84 | HR 83 | Temp 97.3°F | Ht 69.0 in | Wt 195.2 lb

## 2021-03-05 DIAGNOSIS — Z0000 Encounter for general adult medical examination without abnormal findings: Secondary | ICD-10-CM | POA: Diagnosis not present

## 2021-03-05 DIAGNOSIS — I10 Essential (primary) hypertension: Secondary | ICD-10-CM | POA: Diagnosis not present

## 2021-03-05 DIAGNOSIS — R7989 Other specified abnormal findings of blood chemistry: Secondary | ICD-10-CM | POA: Diagnosis not present

## 2021-03-05 NOTE — Patient Instructions (Signed)
Managing Your Hypertension Hypertension, also called high blood pressure, is when the force of the blood pressing against the walls of the arteries is too strong. Arteries are blood vessels that carry blood from your heart throughout your body. Hypertension forces the heart to work harder to pump blood and may cause the arteries to become narrow or stiff. Understanding blood pressure readings Your personal target blood pressure may vary depending on your medical conditions, your age, and other factors. A blood pressure reading includes a higher number over a lower number. Ideally, your blood pressure should be below 120/80. You should know that:  The first, or top, number is called the systolic pressure. It is a measure of the pressure in your arteries as your heart beats.  The second, or bottom number, is called the diastolic pressure. It is a measure of the pressure in your arteries as the heart relaxes. Blood pressure is classified into four stages. Based on your blood pressure reading, your health care provider may use the following stages to determine what type of treatment you need, if any. Systolic pressure and diastolic pressure are measured in a unit called mmHg. Normal  Systolic pressure: below 120.  Diastolic pressure: below 80. Elevated  Systolic pressure: 120-129.  Diastolic pressure: below 80. Hypertension stage 1  Systolic pressure: 130-139.  Diastolic pressure: 80-89. Hypertension stage 2  Systolic pressure: 140 or above.  Diastolic pressure: 90 or above. How can this condition affect me? Managing your hypertension is an important responsibility. Over time, hypertension can damage the arteries and decrease blood flow to important parts of the body, including the brain, heart, and kidneys. Having untreated or uncontrolled hypertension can lead to:  A heart attack.  A stroke.  A weakened blood vessel (aneurysm).  Heart failure.  Kidney damage.  Eye  damage.  Metabolic syndrome.  Memory and concentration problems.  Vascular dementia. What actions can I take to manage this condition? Hypertension can be managed by making lifestyle changes and possibly by taking medicines. Your health care provider will help you make a plan to bring your blood pressure within a normal range. Nutrition  Eat a diet that is high in fiber and potassium, and low in salt (sodium), added sugar, and fat. An example eating plan is called the Dietary Approaches to Stop Hypertension (DASH) diet. To eat this way: ? Eat plenty of fresh fruits and vegetables. Try to fill one-half of your plate at each meal with fruits and vegetables. ? Eat whole grains, such as whole-wheat pasta, brown rice, or whole-grain bread. Fill about one-fourth of your plate with whole grains. ? Eat low-fat dairy products. ? Avoid fatty cuts of meat, processed or cured meats, and poultry with skin. Fill about one-fourth of your plate with lean proteins such as fish, chicken without skin, beans, eggs, and tofu. ? Avoid pre-made and processed foods. These tend to be higher in sodium, added sugar, and fat.  Reduce your daily sodium intake. Most people with hypertension should eat less than 1,500 mg of sodium a day.   Lifestyle  Work with your health care provider to maintain a healthy body weight or to lose weight. Ask what an ideal weight is for you.  Get at least 30 minutes of exercise that causes your heart to beat faster (aerobic exercise) most days of the week. Activities may include walking, swimming, or biking.  Include exercise to strengthen your muscles (resistance exercise), such as weight lifting, as part of your weekly exercise routine. Try   to do these types of exercises for 30 minutes at least 3 days a week.  Do not use any products that contain nicotine or tobacco, such as cigarettes, e-cigarettes, and chewing tobacco. If you need help quitting, ask your health care  provider.  Control any long-term (chronic) conditions you have, such as high cholesterol or diabetes.  Identify your sources of stress and find ways to manage stress. This may include meditation, deep breathing, or making time for fun activities.   Alcohol use  Do not drink alcohol if: ? Your health care provider tells you not to drink. ? You are pregnant, may be pregnant, or are planning to become pregnant.  If you drink alcohol: ? Limit how much you use to:  0-1 drink a day for women.  0-2 drinks a day for men. ? Be aware of how much alcohol is in your drink. In the [place] one drink equals one 12 oz bottle of beer (355 mL), one 5 oz glass of wine (148 mL), or one 1 oz glass of hard liquor (44 mL). Medicines Your health care provider may prescribe medicine if lifestyle changes are not enough to get your blood pressure under control and if:  Your systolic blood pressure is 130 or higher.  Your diastolic blood pressure is 80 or higher. Take medicines only as told by your health care provider. Follow the directions carefully. Blood pressure medicines must be taken as told by your health care provider. The medicine does not work as well when you skip doses. Skipping doses also puts you at risk for problems. Monitoring Before you monitor your blood pressure:  Do not smoke, drink caffeinated beverages, or exercise within 30 minutes before taking a measurement.  Use the bathroom and empty your bladder (urinate).  Sit quietly for at least 5 minutes before taking measurements. Monitor your blood pressure at home as told by your health care provider. To do this:  Sit with your back straight and supported.  Place your feet flat on the floor. Do not cross your legs.  Support your arm on a flat surface, such as a table. Make sure your upper arm is at heart level.  Each time you measure, take two or three readings one minute apart and record the results. You may also need to have your  blood pressure checked regularly by your health care provider.   General information  Talk with your health care provider about your diet, exercise habits, and other lifestyle factors that may be contributing to hypertension.  Review all the medicines you take with your health care provider because there may be side effects or interactions.  Keep all visits as told by your health care provider. Your health care provider can help you create and adjust your plan for managing your high blood pressure. Where to find more information  National Heart, Lung, and Blood Institute: www.nhlbi.nih.gov  American Heart Association: www.heart.org Contact a health care provider if:  You think you are having a reaction to medicines you have taken.  You have repeated (recurrent) headaches.  You feel dizzy.  You have swelling in your ankles.  You have trouble with your vision. Get help right away if:  You develop a severe headache or confusion.  You have unusual weakness or numbness, or you feel faint.  You have severe pain in your chest or abdomen.  You vomit repeatedly.  You have trouble breathing. These symptoms may represent a serious problem that is an emergency. Do not wait   to see if the symptoms will go away. Get medical help right away. Call your local emergency services (911 in the [place] Do not drive yourself to the hospital. Summary  Hypertension is when the force of blood pumping through your arteries is too strong. If this condition is not controlled, it may put you at risk for serious complications.  Your personal target blood pressure may vary depending on your medical conditions, your age, and other factors. For most people, a normal blood pressure is less than 120/80.  Hypertension is managed by lifestyle changes, medicines, or both.  Lifestyle changes to help manage hypertension include losing weight, eating a healthy, low-sodium diet, exercising more, stopping smoking, and  limiting alcohol. This information is not intended to replace advice given to you by your health care provider. Make sure you discuss any questions you have with your health care provider. Document Revised: 11/16/2019 Document Reviewed: 09/11/2019 Elsevier Patient Education  2021 [place] Maintenance, Male Adopting a healthy lifestyle and getting preventive care are important in promoting health and wellness. Ask your health care provider about:  The right schedule for you to have regular tests and exams.  Things you can do on your own to prevent diseases and keep yourself healthy. What should I know about diet, weight, and exercise? Eat a healthy diet  Eat a diet that includes plenty of vegetables, fruits, low-fat dairy products, and lean protein.  Do not eat a lot of foods that are high in solid fats, added sugars, or sodium.   Maintain a healthy weight Body mass index (BMI) is a measurement that can be used to identify possible weight problems. It estimates body fat based on height and weight. Your health care provider can help determine your BMI and help you achieve or maintain a healthy weight. Get regular exercise Get regular exercise. This is one of the most important things you can do for your health. Most adults should:  Exercise for at least 150 minutes each week. The exercise should increase your heart rate and make you sweat (moderate-intensity exercise).  Do strengthening exercises at least twice a week. This is in addition to the moderate-intensity exercise.  Spend less time sitting. Even light physical activity can be beneficial. Watch cholesterol and blood lipids Have your blood tested for lipids and cholesterol at 51 years of age, then have this test every 5 years. You may need to have your cholesterol levels checked more often if:  Your lipid or cholesterol levels are high.  You are older than 51 years of age.  You are at high risk for heart  disease. What should I know about cancer screening? Many types of cancers can be detected early and may often be prevented. Depending on your health history and family history, you may need to have cancer screening at various ages. This may include screening for:  Colorectal cancer.  Prostate cancer.  Skin cancer.  Lung cancer. What should I know about heart disease, diabetes, and high blood pressure? Blood pressure and heart disease  High blood pressure causes heart disease and increases the risk of stroke. This is more likely to develop in people who have high blood pressure readings, are of African descent, or are overweight.  Talk with your health care provider about your target blood pressure readings.  Have your blood pressure checked: ? Every 3-5 years if you are 78-39 years of age. ? Every year if you are 3 years old or older.  If you  are between the ages of 4865 and 4775 and are a current or former smoker, ask your health care provider if you should have a one-time screening for abdominal aortic aneurysm (AAA). Diabetes Have regular diabetes screenings. This checks your fasting blood sugar level. Have the screening done:  Once every three years after age 51 if you are at a normal weight and have a low risk for diabetes.  More often and at a younger age if you are overweight or have a high risk for diabetes. What should I know about preventing infection? Hepatitis B If you have a higher risk for hepatitis B, you should be screened for this virus. Talk with your health care provider to find out if you are at risk for hepatitis B infection. Hepatitis C Blood testing is recommended for:  Everyone born from [id number] through 1966.  Anyone with known risk factors for hepatitis C. Sexually transmitted infections (STIs)  You should be screened each year for STIs, including gonorrhea and chlamydia, if: ? You are sexually active and are younger than 51 years of age. ? You are older  than 51 years of age and your health care provider tells you that you are at risk for this type of infection. ? Your sexual activity has changed since you were last screened, and you are at increased risk for chlamydia or gonorrhea. Ask your health care provider if you are at risk.  Ask your health care provider about whether you are at high risk for HIV. Your health care provider may recommend a prescription medicine to help prevent HIV infection. If you choose to take medicine to prevent HIV, you should first get tested for HIV. You should then be tested every 3 months for as long as you are taking the medicine. Follow these instructions at home: Lifestyle  Do not use any products that contain nicotine or tobacco, such as cigarettes, e-cigarettes, and chewing tobacco. If you need help quitting, ask your health care provider.  Do not use street drugs.  Do not share needles.  Ask your health care provider for help if you need support or information about quitting drugs. Alcohol use  Do not drink alcohol if your health care provider tells you not to drink.  If you drink alcohol: ? Limit how much you have to 0-2 drinks a day. ? Be aware of how much alcohol is in your drink. In the [place] one drink equals one 12 oz bottle of beer (355 mL), one 5 oz glass of wine (148 mL), or one 1 oz glass of hard liquor (44 mL). General instructions  Schedule regular health, dental, and eye exams.  Stay current with your vaccines.  Tell your health care provider if: ? You often feel depressed. ? You have ever been abused or do not feel safe at home. Summary  Adopting a healthy lifestyle and getting preventive care are important in promoting health and wellness.  Follow your health care provider's instructions about healthy diet, exercising, and getting tested or screened for diseases.  Follow your health care provider's instructions on monitoring your cholesterol and blood pressure. This  information is not intended to replace advice given to you by your health care provider. Make sure you discuss any questions you have with your health care provider. Document Revised: 10/04/2018 Document Reviewed: 10/04/2018 Elsevier Patient Education  2021 [employer]  Preventive Care 3940-51 Years Old, Male Preventive care refers to lifestyle choices and visits with your health care provider that can promote  health and wellness. This includes:  A yearly physical exam. This is also called an annual wellness visit.  Regular dental and eye exams.  Immunizations.  Screening for certain conditions.  Healthy lifestyle choices, such as: ? Eating a healthy diet. ? Getting regular exercise. ? Not using drugs or products that contain nicotine and tobacco. ? Limiting alcohol use. What can I expect for my preventive care visit? Physical exam Your health care provider will check your:  Height and weight. These may be used to calculate your BMI (body mass index). BMI is a measurement that tells if you are at a healthy weight.  Heart rate and blood pressure.  Body temperature.  Skin for abnormal spots. Counseling Your health care provider may ask you questions about your:  Past medical problems.  Family's medical history.  Alcohol, tobacco, and drug use.  Emotional well-being.  Home life and relationship well-being.  Sexual activity.  Diet, exercise, and sleep habits.  Work and work Statistician.  Access to firearms. What immunizations do I need? Vaccines are usually given at various ages, according to a schedule. Your health care provider will recommend vaccines for you based on your age, medical history, and lifestyle or other factors, such as travel or where you work.   What tests do I need? Blood tests  Lipid and cholesterol levels. These may be checked every 5 years, or more often if you are over 62 years old.  Hepatitis C test.  Hepatitis B  test. Screening  Lung cancer screening. You may have this screening every year starting at age 77 if you have a 30-pack-year history of smoking and currently smoke or have quit within the past 15 years.  Prostate cancer screening. Recommendations will vary depending on your family history and other risks.  Genital exam to check for testicular cancer or hernias.  Colorectal cancer screening. ? All adults should have this screening starting at age 82 and continuing until age 72. ? Your health care provider may recommend screening at age 70 if you are at increased risk. ? You will have tests every 1-10 years, depending on your results and the type of screening test.  Diabetes screening. ? This is done by checking your blood sugar (glucose) after you have not eaten for a while (fasting). ? You may have this done every 1-3 years.  STD (sexually transmitted disease) testing, if you are at risk. Follow these instructions at home: Eating and drinking  Eat a diet that includes fresh fruits and vegetables, whole grains, lean protein, and low-fat dairy products.  Take vitamin and mineral supplements as recommended by your health care provider.  Do not drink alcohol if your health care provider tells you not to drink.  If you drink alcohol: ? Limit how much you have to 0-2 drinks a day. ? Be aware of how much alcohol is in your drink. In the [place] one drink equals one 12 oz bottle of beer (355 mL), one 5 oz glass of wine (148 mL), or one 1 oz glass of hard liquor (44 mL).   Lifestyle  Take daily care of your teeth and gums. Brush your teeth every morning and night with fluoride toothpaste. Floss one time each day.  Stay active. Exercise for at least 30 minutes 5 or more days each week.  Do not use any products that contain nicotine or tobacco, such as cigarettes, e-cigarettes, and chewing tobacco. If you need help quitting, ask your health care provider.  Do not use  drugs.  If you are  sexually active, practice safe sex. Use a condom or other form of protection to prevent STIs (sexually transmitted infections).  If told by your health care provider, take low-dose aspirin daily starting at age 8.  Find healthy ways to cope with stress, such as: ? Meditation, yoga, or listening to music. ? Journaling. ? Talking to a trusted person. ? Spending time with friends and family. Safety  Always wear your seat belt while driving or riding in a vehicle.  Do not drive: ? If you have been drinking alcohol. Do not ride with someone who has been drinking. ? When you are tired or distracted. ? While texting.  Wear a helmet and other protective equipment during sports activities.  If you have firearms in your house, make sure you follow all gun safety procedures. What's next?  Go to your health care provider once a year for an annual wellness visit.  Ask your health care provider how often you should have your eyes and teeth checked.  Stay up to date on all vaccines. This information is not intended to replace advice given to you by your health care provider. Make sure you discuss any questions you have with your health care provider. Document Revised: 07/10/2019 Document Reviewed: 10/05/2018 Elsevier Patient Education  2021 [employer].

## 2021-03-05 NOTE — Progress Notes (Signed)
Established Patient Office Visit  Subjective:  Patient ID: John Chen, male    DOB: [birth year]  Age: 50 y.o. MRN: [id number]  CC:  Chief Complaint  Patient presents with  . Annual Exam    CPE, no concerns. Patient not fasting.     HPI John Chen presents for follow-up of his hypertension, recent hospital visit and health maintenance.  Continues to do well with chlorthalidone.  Blood pressure is well controlled with it.  Was seen in the hospital for acute back pain after lifting injury.  Back is without pain.  He realizes when the injury occurred and will not repeat that mechanism.  Past Medical History:  Diagnosis Date  . Hypertension     History reviewed. No pertinent surgical history.  Family History  Problem Relation Age of Onset  . Hypertension Mother   . Hypertension Father   . Asthma Sister   . Hypertension Sister   . Drug abuse Brother   . Early death Brother   . Heart attack Brother   . Hypertension Maternal Grandmother   . Asthma Sister   . Alcohol abuse Neg Hx   . Cancer Neg Hx   . Diabetes Neg Hx   . Hearing loss Neg Hx   . Heart disease Neg Hx   . Hyperlipidemia Neg Hx   . Kidney disease Neg Hx   . Stroke Neg Hx     Social History   Socioeconomic History  . Marital status: Married    Spouse name: Not on file  . Number of children: Not on file  . Years of education: Not on file  . Highest education level: Not on file  Occupational History  . Not on file  Tobacco Use  . Smoking status: Never Smoker  . Smokeless tobacco: Never Used  Vaping Use  . Vaping Use: Never used  Substance and Sexual Activity  . Alcohol use: Yes    Alcohol/week: 2.0 standard drinks    Types: 1 Cans of beer, 1 Glasses of wine per week  . Drug use: No  . Sexual activity: Yes  Other Topics Concern  . Not on file  Social History Narrative  . Not on file   Social Determinants of Health   Financial Resource Strain: Not on file  Food Insecurity: Not on file   Transportation Needs: Not on file  Physical Activity: Not on file  Stress: Not on file  Social Connections: Not on file  Intimate Partner Violence: Not on file    Outpatient Medications Prior to Visit  Medication Sig Dispense Refill  . chlorthalidone (HYGROTON) 25 MG tablet Take 1 tablet (25 mg total) by mouth daily. 90 tablet 1  . cyclobenzaprine (FLEXERIL) 10 MG tablet Take 1 tablet (10 mg total) by mouth 2 (two) times daily as needed for muscle spasms. 20 tablet 0  . HYDROcodone-acetaminophen (NORCO/VICODIN) 5-325 MG tablet Take 1 tablet by mouth every 4 (four) hours as needed. 15 tablet 0  . lidocaine (LIDODERM) 5 % Place 1 patch onto the skin daily. Remove & Discard patch within 12 hours or as directed by MD 30 patch 0   No facility-administered medications prior to visit.    No Known Allergies  ROS Review of Systems  Constitutional: Negative.   HENT: Negative.   Eyes: Negative for photophobia and visual disturbance.  Respiratory: Negative.   Cardiovascular: Negative.   Gastrointestinal: Negative.   Endocrine: Negative for polyphagia and polyuria.  Genitourinary: Negative for difficulty urinating, frequency and urgency.  Musculoskeletal: Negative for back pain.  Neurological: Negative for weakness and numbness.  Psychiatric/Behavioral: Negative.       Objective:    Physical Exam Vitals and nursing note reviewed.  Constitutional:      General: He is not in acute distress.    Appearance: Normal appearance. He is not ill-appearing, toxic-appearing or diaphoretic.  HENT:     Head: Normocephalic and atraumatic.     Right Ear: Tympanic membrane, ear canal and external ear normal.     Left Ear: Tympanic membrane, ear canal and external ear normal.     Mouth/Throat:     Mouth: Mucous membranes are moist.     Pharynx: Oropharynx is clear. No oropharyngeal exudate or posterior oropharyngeal erythema.  Eyes:     General: No scleral icterus.       Right eye: No  discharge.        Left eye: No discharge.     Extraocular Movements: Extraocular movements intact.     Conjunctiva/sclera: Conjunctivae normal.     Pupils: Pupils are equal, round, and reactive to light.  Neck:     Vascular: No carotid bruit.  Cardiovascular:     Rate and Rhythm: Normal rate and regular rhythm.     Pulses:          Carotid pulses are 2+ on the right side and 2+ on the left side. Pulmonary:     Effort: Pulmonary effort is normal.     Breath sounds: Normal breath sounds.  Abdominal:     General: Abdomen is flat. Bowel sounds are normal. There is no distension.     Palpations: There is no mass.     Tenderness: There is no abdominal tenderness. There is no guarding or rebound.     Hernia: A hernia is present. Hernia is present in the umbilical area and ventral area.  Musculoskeletal:     Cervical back: No rigidity or tenderness.  Lymphadenopathy:     Cervical: No cervical adenopathy.  Skin:    General: Skin is warm and dry.  Neurological:     Mental Status: He is alert and oriented to person, place, and time.  Psychiatric:        Mood and Affect: Mood normal.        Behavior: Behavior normal.     BP 122/84   Pulse 83   Temp (!) 97.3 F (36.3 C) (Temporal)   Ht '5\' 9"'  (1.753 m)   Wt 195 lb 3.2 oz (88.5 kg)   SpO2 95%   BMI 28.83 kg/m  Wt Readings from Last 3 Encounters:  03/05/21 195 lb 3.2 oz (88.5 kg)  01/11/21 202 lb 3.2 oz (91.7 kg)  01/07/21 202 lb 3.2 oz (91.7 kg)     Health Maintenance Due  Topic Date Due  . Hepatitis C Screening  Never done  . COLONOSCOPY (Pts 45-48yr Insurance coverage will need to be confirmed)  Never done    There are no preventive care reminders to display for this patient.  Lab Results  Component Value Date   TSH 1.81 04/10/2019   Lab Results  Component Value Date   WBC 5.2 01/11/2021   HGB 15.7 01/11/2021   HCT 45.2 01/11/2021   MCV 87.9 01/11/2021   PLT 217 01/11/2021   Lab Results  Component Value Date    NA 137 01/11/2021   K 3.9 01/11/2021   CO2 22 01/11/2021   GLUCOSE 99 01/11/2021   BUN 15 01/11/2021   CREATININE  1.27 (H) 01/11/2021   BILITOT 2.0 (H) 01/11/2021   ALKPHOS 40 01/11/2021   AST 54 (H) 01/11/2021   ALT 43 01/11/2021   PROT 8.1 01/11/2021   ALBUMIN 4.7 01/11/2021   CALCIUM 10.3 01/11/2021   ANIONGAP 12 01/11/2021   EGFR 75 01/07/2021   GFR 72.58 01/01/2020   Lab Results  Component Value Date   CHOL 168 01/01/2020   Lab Results  Component Value Date   HDL 51.60 01/01/2020   Lab Results  Component Value Date   LDLCALC 94 01/01/2020   Lab Results  Component Value Date   TRIG 109.0 01/01/2020   Lab Results  Component Value Date   CHOLHDL 3 01/01/2020   No results found for: HGBA1C    Assessment & Plan:   Problem List Items Addressed This Visit      Cardiovascular and Mediastinum   Essential hypertension - Primary   Relevant Orders   Lipid panel   Urinalysis, Routine w reflex microscopic     Other   Health care maintenance   Relevant Orders   PSA   Ambulatory referral to Gastroenterology    Other Visit Diagnoses    Elevated liver function tests       Relevant Orders   Hepatic function panel   Hepatitis C antibody      No orders of the defined types were placed in this encounter.   Follow-up: Return in about 6 months (around 09/05/2021), or Return fasting for ordered bloodwork, please..  Patient will return fasting for above ordered labs.  Recheck elevated liver enzyme from his hospital visit along with a hep C.  Agrees to go for his first colonoscopy.  He will be 6 hours fasting he has his lab work drawn.  Continue chlorthalidone for high blood pressure.  Given information on health maintenance, to disease prevention and managing hypertension.  Libby Maw, MD

## 2021-03-06 ENCOUNTER — Other Ambulatory Visit: Payer: BC Managed Care – PPO

## 2021-03-06 DIAGNOSIS — I10 Essential (primary) hypertension: Secondary | ICD-10-CM | POA: Diagnosis not present

## 2021-03-06 DIAGNOSIS — R7989 Other specified abnormal findings of blood chemistry: Secondary | ICD-10-CM | POA: Diagnosis not present

## 2021-03-06 NOTE — Addendum Note (Signed)
Addended by: Lynnea Ferrier on: 03/06/2021 03:08 PM   Modules accepted: Orders

## 2021-03-07 LAB — TIQ- MISLABELED: Test Ordered On Req: 10256

## 2021-03-09 LAB — URINALYSIS, ROUTINE W REFLEX MICROSCOPIC
Bacteria, UA: NONE SEEN /HPF
Bilirubin Urine: NEGATIVE
Glucose, UA: NEGATIVE
Hgb urine dipstick: NEGATIVE
Hyaline Cast: NONE SEEN /LPF
Ketones, ur: NEGATIVE
Nitrite: NEGATIVE
Protein, ur: NEGATIVE
Specific Gravity, Urine: 1.021 (ref 1.001–1.035)
Squamous Epithelial / HPF: NONE SEEN /HPF (ref <=5)
WBC, UA: NONE SEEN /HPF (ref 0–5)
pH: 6.5 (ref 5.0–8.0)

## 2021-03-09 LAB — MICROSCOPIC MESSAGE

## 2021-03-09 LAB — EXTRA URINE SPECIMEN

## 2021-03-10 ENCOUNTER — Other Ambulatory Visit: Payer: BC Managed Care – PPO

## 2021-03-10 ENCOUNTER — Telehealth: Payer: Self-pay

## 2021-03-10 NOTE — Telephone Encounter (Signed)
Called patient to let him know we need to redraw due to lab error. He will be coming back this week, probably Thursday,

## 2021-03-12 ENCOUNTER — Other Ambulatory Visit: Payer: BC Managed Care – PPO

## 2021-03-12 NOTE — Addendum Note (Signed)
Addended by: Lynnea Ferrier on: 03/12/2021 01:14 PM   Modules accepted: Orders

## 2021-03-17 ENCOUNTER — Other Ambulatory Visit (INDEPENDENT_AMBULATORY_CARE_PROVIDER_SITE_OTHER): Payer: BC Managed Care – PPO

## 2021-03-17 ENCOUNTER — Other Ambulatory Visit: Payer: Self-pay

## 2021-03-17 DIAGNOSIS — Z0000 Encounter for general adult medical examination without abnormal findings: Secondary | ICD-10-CM | POA: Diagnosis not present

## 2021-03-17 DIAGNOSIS — Z125 Encounter for screening for malignant neoplasm of prostate: Secondary | ICD-10-CM | POA: Diagnosis not present

## 2021-03-17 DIAGNOSIS — R7989 Other specified abnormal findings of blood chemistry: Secondary | ICD-10-CM | POA: Diagnosis not present

## 2021-03-17 DIAGNOSIS — I10 Essential (primary) hypertension: Secondary | ICD-10-CM | POA: Diagnosis not present

## 2021-03-17 NOTE — Progress Notes (Signed)
Per orders of Dr. Ethelene Hal pt is here for labs, tolerated draw well.

## 2021-03-18 LAB — LDL CHOLESTEROL, DIRECT: Direct LDL: 65 mg/dL

## 2021-03-18 LAB — HEPATIC FUNCTION PANEL
ALT: 37 U/L (ref 0–53)
AST: 31 U/L (ref 0–37)
Albumin: 4.6 g/dL (ref 3.5–5.2)
Alkaline Phosphatase: 52 U/L (ref 39–117)
Bilirubin, Direct: 0.1 mg/dL (ref 0.0–0.3)
Total Bilirubin: 0.5 mg/dL (ref 0.2–1.2)
Total Protein: 7.1 g/dL (ref 6.0–8.3)

## 2021-03-18 LAB — PSA: PSA: 0.25 ng/mL (ref 0.10–4.00)

## 2021-03-18 LAB — LIPID PANEL
Cholesterol: 170 mg/dL (ref 0–200)
HDL: 33.2 mg/dL — ABNORMAL LOW (ref >39.00)
Total CHOL/HDL Ratio: 5
Triglycerides: 914 mg/dL — ABNORMAL HIGH (ref 0.0–149.0)

## 2021-03-18 LAB — HEPATITIS C ANTIBODY
Hepatitis C Ab: NONREACTIVE
SIGNAL TO CUT-OFF: 0.31 (ref <1.00)

## 2021-03-27 ENCOUNTER — Other Ambulatory Visit: Payer: Self-pay

## 2021-03-27 ENCOUNTER — Encounter: Payer: Self-pay | Admitting: Family Medicine

## 2021-03-27 DIAGNOSIS — I10 Essential (primary) hypertension: Secondary | ICD-10-CM

## 2021-03-27 MED ORDER — CHLORTHALIDONE 25 MG PO TABS: 25.0000 mg | ORAL_TABLET | Freq: Every day | ORAL | 1 refills | Status: DC

## 2021-03-30 ENCOUNTER — Telehealth (INDEPENDENT_AMBULATORY_CARE_PROVIDER_SITE_OTHER): Payer: BC Managed Care – PPO | Admitting: Family Medicine

## 2021-03-30 ENCOUNTER — Encounter: Payer: Self-pay | Admitting: Family Medicine

## 2021-03-30 VITALS — Ht 69.0 in | Wt 188.0 lb

## 2021-03-30 DIAGNOSIS — E781 Pure hyperglyceridemia: Secondary | ICD-10-CM | POA: Diagnosis not present

## 2021-03-30 NOTE — Progress Notes (Signed)
Established Patient Office Visit  Subjective:  Patient ID: John Chen, male    DOB: [birth year]  Age: 50 y.o. MRN: [id number]  CC:  Chief Complaint  Patient presents with  . Follow-up    F/u to discuss lab results.      HPI John Chen presents for follow-up of elevated triglycerides.  He was nonfasting when he came in for his physical.  He had eaten a few burgers a few hours before.  Triglycerides have been normal in the past.  AST has been elevated with original blood work but then came down on follow-up.  Past Medical History:  Diagnosis Date  . Hypertension     No past surgical history on file.  Family History  Problem Relation Age of Onset  . Hypertension Mother   . Hypertension Father   . Asthma Sister   . Hypertension Sister   . Drug abuse Brother   . Early death Brother   . Heart attack Brother   . Hypertension Maternal Grandmother   . Asthma Sister   . Alcohol abuse Neg Hx   . Cancer Neg Hx   . Diabetes Neg Hx   . Hearing loss Neg Hx   . Heart disease Neg Hx   . Hyperlipidemia Neg Hx   . Kidney disease Neg Hx   . Stroke Neg Hx     Social History   Socioeconomic History  . Marital status: Married    Spouse name: Not on file  . Number of children: Not on file  . Years of education: Not on file  . Highest education level: Not on file  Occupational History  . Not on file  Tobacco Use  . Smoking status: Never Smoker  . Smokeless tobacco: Never Used  Vaping Use  . Vaping Use: Never used  Substance and Sexual Activity  . Alcohol use: Yes    Alcohol/week: 2.0 standard drinks    Types: 1 Cans of beer, 1 Glasses of wine per week  . Drug use: No  . Sexual activity: Yes  Other Topics Concern  . Not on file  Social History Narrative  . Not on file   Social Determinants of Health   Financial Resource Strain: Not on file  Food Insecurity: Not on file  Transportation Needs: Not on file  Physical Activity: Not on file  Stress: Not on file   Social Connections: Not on file  Intimate Partner Violence: Not on file    Outpatient Medications Prior to Visit  Medication Sig Dispense Refill  . chlorthalidone (HYGROTON) 25 MG tablet Take 1 tablet (25 mg total) by mouth daily. 90 tablet 1   No facility-administered medications prior to visit.    No Known Allergies  ROS Review of Systems  Constitutional: Negative.   Respiratory: Negative.   Cardiovascular: Negative.   Gastrointestinal: Negative.       Objective:    Physical Exam Vitals and nursing note reviewed.  Constitutional:      General: He is not in acute distress.    Appearance: Normal appearance. He is not ill-appearing or toxic-appearing.  Eyes:     General: No scleral icterus.       Right eye: No discharge.        Left eye: No discharge.     Conjunctiva/sclera: Conjunctivae normal.  Pulmonary:     Effort: Pulmonary effort is normal.  Neurological:     Mental Status: He is alert and oriented to person, place, and time.  Psychiatric:  Mood and Affect: Mood normal.        Behavior: Behavior normal.     Ht '5\' 9"'  (1.753 m)   Wt 188 lb (85.3 kg) Comment: pt reported  BMI 27.76 kg/m  Wt Readings from Last 3 Encounters:  03/30/21 188 lb (85.3 kg)  03/05/21 195 lb 3.2 oz (88.5 kg)  01/11/21 202 lb 3.2 oz (91.7 kg)     Health Maintenance Due  Topic Date Due  . Pneumococcal Vaccine 66-37 Years old (1 of 4 - PCV13) Never done  . COLONOSCOPY (Pts 45-34yr Insurance coverage will need to be confirmed)  Never done  . Zoster Vaccines- Shingrix (1 of 2) Never done    There are no preventive care reminders to display for this patient.  Lab Results  Component Value Date   TSH 1.81 04/10/2019   Lab Results  Component Value Date   WBC 5.2 01/11/2021   HGB 15.7 01/11/2021   HCT 45.2 01/11/2021   MCV 87.9 01/11/2021   PLT 217 01/11/2021   Lab Results  Component Value Date   NA 137 01/11/2021   K 3.9 01/11/2021   CO2 22 01/11/2021   GLUCOSE  99 01/11/2021   BUN 15 01/11/2021   CREATININE 1.27 (H) 01/11/2021   BILITOT 0.5 03/17/2021   ALKPHOS 52 03/17/2021   AST 31 03/17/2021   ALT 37 03/17/2021   PROT 7.1 03/17/2021   ALBUMIN 4.6 03/17/2021   CALCIUM 10.3 01/11/2021   ANIONGAP 12 01/11/2021   EGFR 75 01/07/2021   GFR 72.58 01/01/2020   Lab Results  Component Value Date   CHOL 170 03/17/2021   Lab Results  Component Value Date   HDL 33.20 (L) 03/17/2021   Lab Results  Component Value Date   LDLCALC 94 01/01/2020   Lab Results  Component Value Date   TRIG (H) 03/17/2021    914.0 Triglyceride is over 400; calculations on Lipids are invalid.   Lab Results  Component Value Date   CHOLHDL 5 03/17/2021   No results found for: HGBA1C    Assessment & Plan:   Problem List Items Addressed This Visit   None   Visit Diagnoses    Hypertriglyceridemia    -  Primary   Relevant Orders   Lipid panel   LDL cholesterol, direct      No orders of the defined types were placed in this encounter.   Follow-up: Return Return fasting for repeat lipid profile..  Will return fasting for redraw his lipid profile at his convenience.  That should be back down to normal.  John Maw MD   Virtual Visit via Video Note  I connected with GTyqwan Pinkon 03/30/21 at  3:00 PM EDT by a video enabled telemedicine application and verified that I am speaking with the correct person using two identifiers.  Location: Patient: home alone in his office.  Provider: work   I discussed the limitations of evaluation and management by telemedicine and the availability of in person appointments. The patient expressed understanding and agreed to proceed.  History of Present Illness:    Observations/Objective:   Assessment and Plan:   Follow Up Instructions:    I discussed the assessment and treatment plan with the patient. The patient was provided an opportunity to ask questions and all were answered. The patient  agreed with the plan and demonstrated an understanding of the instructions.   The patient was advised to call back or seek an in-person evaluation if the symptoms  worsen or if the condition fails to improve as anticipated.  I provided 20 minutes of non-face-to-face time during this encounter.   Libby Maw, MD

## 2021-04-01 ENCOUNTER — Other Ambulatory Visit: Payer: Self-pay

## 2021-04-01 ENCOUNTER — Other Ambulatory Visit (INDEPENDENT_AMBULATORY_CARE_PROVIDER_SITE_OTHER): Payer: BC Managed Care – PPO

## 2021-04-01 DIAGNOSIS — E781 Pure hyperglyceridemia: Secondary | ICD-10-CM

## 2021-04-01 LAB — LIPID PANEL
Cholesterol: 179 mg/dL (ref 0–200)
HDL: 45.3 mg/dL (ref >39.00)
NonHDL: 133.87
Total CHOL/HDL Ratio: 4
Triglycerides: 280 mg/dL — ABNORMAL HIGH (ref 0.0–149.0)
VLDL: 56 mg/dL — ABNORMAL HIGH (ref 0.0–40.0)

## 2021-04-01 LAB — LDL CHOLESTEROL, DIRECT: Direct LDL: 83 mg/dL

## 2021-04-01 NOTE — Progress Notes (Signed)
Per orders of Dr. Ethelene Hal pt is here for labs, pt is fasting, pt tolerated draw well.

## 2021-07-08 ENCOUNTER — Other Ambulatory Visit: Payer: Self-pay

## 2021-07-10 ENCOUNTER — Ambulatory Visit: Payer: BC Managed Care – PPO | Admitting: Family Medicine

## 2021-07-10 ENCOUNTER — Encounter: Payer: Self-pay | Admitting: Family Medicine

## 2021-07-10 ENCOUNTER — Other Ambulatory Visit: Payer: Self-pay

## 2021-07-10 VITALS — BP 128/84 | HR 57 | Temp 97.3°F | Ht 69.0 in | Wt 198.6 lb

## 2021-07-10 DIAGNOSIS — R519 Headache, unspecified: Secondary | ICD-10-CM | POA: Diagnosis not present

## 2021-07-10 DIAGNOSIS — I10 Essential (primary) hypertension: Secondary | ICD-10-CM | POA: Diagnosis not present

## 2021-07-10 NOTE — Progress Notes (Signed)
Established Patient Office Visit  Subjective:  Patient ID: John Chen, male    DOB: 01-09-1971  Age: 50 y.o. MRN: [id number]  CC:  Chief Complaint  Patient presents with   Pain    C/O sharpe pain in head behind left eye x 1 month becoming worse have caused blurred vision in left eye when pain is present.     HPI John Chen presents for evaluation of approximately a month-long history of headaches in the left upper peri orbital area seem to be localized behind the eye.  Over the month the headaches have become more frequent and associated pain has intensified.  The headaches can last for seconds, minutes and up to a half a day.  He has had loss of vision in the left eye associated with a headache.  Headache is moved to the left side of his face.  Denies prodromal symptoms but believes he sees spots perhaps during the headaches.  Denies nausea or vomiting.  Denies stuffy nose drainage rhinorrhea sneezing itchy watery eyes ears nose or throat.  He has no personal history of headaches.  His sister has migraines.  Denies weakness but headaches are sometimes associated with paresthesias in his fingertips of both hands.  No difficulty swallowing.  Normal dilated retinal exam within the last year.  Vision has not changed.  He has tried ibuprofen with some relief.  There has been some increase in stress in his life.  Past Medical History:  Diagnosis Date   Hypertension     History reviewed. No pertinent surgical history.  Family History  Problem Relation Age of Onset   Hypertension Mother    Hypertension Father    Asthma Sister    Hypertension Sister    Drug abuse Brother    Early death Brother    Heart attack Brother    Hypertension Maternal Grandmother    Asthma Sister    Alcohol abuse Neg Hx    Cancer Neg Hx    Diabetes Neg Hx    Hearing loss Neg Hx    Heart disease Neg Hx    Hyperlipidemia Neg Hx    Kidney disease Neg Hx    Stroke Neg Hx     Social History    Socioeconomic History   Marital status: Married    Spouse name: Not on file   Number of children: Not on file   Years of education: Not on file   Highest education level: Not on file  Occupational History   Not on file  Tobacco Use   Smoking status: Never   Smokeless tobacco: Never  Vaping Use   Vaping Use: Never used  Substance and Sexual Activity   Alcohol use: Yes    Alcohol/week: 2.0 standard drinks    Types: 1 Cans of beer, 1 Glasses of wine per week   Drug use: No   Sexual activity: Yes  Other Topics Concern   Not on file  Social History Narrative   Not on file   Social Determinants of Health   Financial Resource Strain: Not on file  Food Insecurity: Not on file  Transportation Needs: Not on file  Physical Activity: Not on file  Stress: Not on file  Social Connections: Not on file  Intimate Partner Violence: Not on file    Outpatient Medications Prior to Visit  Medication Sig Dispense Refill   chlorthalidone (HYGROTON) 25 MG tablet Take 1 tablet (25 mg total) by mouth daily. 90 tablet 1   No facility-administered  medications prior to visit.    No Known Allergies  ROS Review of Systems  Constitutional:  Negative for chills, diaphoresis, fatigue, fever and unexpected weight change.  HENT:  Negative for congestion, postnasal drip, sneezing, sore throat and trouble swallowing.   Eyes:  Positive for pain. Negative for photophobia, discharge, redness, itching and visual disturbance.  Respiratory: Negative.    Cardiovascular: Negative.   Gastrointestinal:  Negative for nausea and vomiting.  Endocrine: Negative for polyphagia and polyuria.  Genitourinary: Negative.   Musculoskeletal:  Negative for gait problem and joint swelling.  Skin:  Negative for rash.  Neurological:  Positive for numbness. Negative for speech difficulty and weakness.     Objective:    Physical Exam Vitals and nursing note reviewed.  Constitutional:      General: He is not in  acute distress.    Appearance: Normal appearance. He is not ill-appearing, toxic-appearing or diaphoretic.  HENT:     Head: Normocephalic and atraumatic.     Right Ear: Tympanic membrane, ear canal and external ear normal.     Left Ear: Tympanic membrane, ear canal and external ear normal.     Mouth/Throat:     Mouth: Mucous membranes are moist.     Pharynx: Oropharynx is clear. No oropharyngeal exudate.  Eyes:     General: No visual field deficit or scleral icterus.       Right eye: No discharge.        Left eye: No discharge.     Extraocular Movements: Extraocular movements intact.     Conjunctiva/sclera: Conjunctivae normal.     Pupils: Pupils are equal, round, and reactive to light.  Cardiovascular:     Rate and Rhythm: Normal rate and regular rhythm.  Pulmonary:     Effort: Pulmonary effort is normal.     Breath sounds: Normal breath sounds.  Abdominal:     General: Bowel sounds are normal.  Musculoskeletal:     Cervical back: No rigidity or tenderness.  Skin:    General: Skin is warm and dry.  Neurological:     Mental Status: He is alert and oriented to person, place, and time.     Cranial Nerves: Cranial nerves are intact. No cranial nerve deficit, dysarthria or facial asymmetry.     Motor: No weakness.  Psychiatric:        Mood and Affect: Mood normal.        Behavior: Behavior normal.    BP 128/84 (BP Location: Right Arm, Patient Position: Sitting, Cuff Size: Normal)   Pulse (!) 57   Temp (!) 97.3 F (36.3 C) (Temporal)   Ht _0  (1.753 m)   Wt 198 lb 9.6 oz (90.1 kg)   SpO2 97%   BMI 29.33 kg/m  Wt Readings from Last 3 Encounters:  07/10/21 198 lb 9.6 oz (90.1 kg)  03/30/21 188 lb (85.3 kg)  03/05/21 195 lb 3.2 oz (88.5 kg)     Health Maintenance Due  Topic Date Due   Zoster Vaccines- Shingrix (1 of 2) Never done   COLONOSCOPY (Pts 45-80yr Insurance coverage will need to be confirmed)  Never done    There are no preventive care reminders to  display for this patient.  Lab Results  Component Value Date   TSH 1.81 04/10/2019   Lab Results  Component Value Date   WBC 5.2 01/11/2021   HGB 15.7 01/11/2021   HCT 45.2 01/11/2021   MCV 87.9 01/11/2021   PLT 217 01/11/2021  Lab Results  Component Value Date   NA 137 01/11/2021   K 3.9 01/11/2021   CO2 22 01/11/2021   GLUCOSE 99 01/11/2021   BUN 15 01/11/2021   CREATININE 1.27 (H) 01/11/2021   BILITOT 0.5 03/17/2021   ALKPHOS 52 03/17/2021   AST 31 03/17/2021   ALT 37 03/17/2021   PROT 7.1 03/17/2021   ALBUMIN 4.6 03/17/2021   CALCIUM 10.3 01/11/2021   ANIONGAP 12 01/11/2021   EGFR 75 01/07/2021   GFR 72.58 01/01/2020   Lab Results  Component Value Date   CHOL 179 04/01/2021   Lab Results  Component Value Date   HDL 45.30 04/01/2021   Lab Results  Component Value Date   LDLCALC 94 01/01/2020   Lab Results  Component Value Date   TRIG 280.0 (H) 04/01/2021   Lab Results  Component Value Date   CHOLHDL 4 04/01/2021   No results found for: HGBA1C    Assessment & Plan:   Problem List Items Addressed This Visit       Cardiovascular and Mediastinum   Essential hypertension - Primary     Other   Nonintractable episodic headache   Relevant Orders   MR Brain W Wo Contrast   CBC   Basic metabolic panel   Sedimentation rate   C-reactive protein    No orders of the defined types were placed in this encounter.   Follow-up: Return in about 1 month (around 08/09/2021), or if symptoms worsen or fail to improve.  To emergency room if headache becomes worse.  Otherwise follow-up after MRI.  Libby Maw, MD

## 2021-07-11 LAB — BASIC METABOLIC PANEL
BUN: 20 mg/dL (ref 7–25)
CO2: 28 mmol/L (ref 20–32)
Calcium: 10.2 mg/dL (ref 8.6–10.3)
Chloride: 101 mmol/L (ref 98–110)
Creat: 1.2 mg/dL (ref 0.70–1.30)
Glucose, Bld: 80 mg/dL (ref 65–99)
Potassium: 3.7 mmol/L (ref 3.5–5.3)
Sodium: 140 mmol/L (ref 135–146)

## 2021-07-11 LAB — CBC
HCT: 41.3 % (ref 38.5–50.0)
Hemoglobin: 14.1 g/dL (ref 13.2–17.1)
MCH: 30.5 pg (ref 27.0–33.0)
MCHC: 34.1 g/dL (ref 32.0–36.0)
MCV: 89.4 fL (ref 80.0–100.0)
MPV: 11.3 fL (ref 7.5–12.5)
Platelets: 223 10*3/uL (ref 140–400)
RBC: 4.62 10*6/uL (ref 4.20–5.80)
RDW: 12.2 % (ref 11.0–15.0)
WBC: 6.5 10*3/uL (ref 3.8–10.8)

## 2021-07-11 LAB — SEDIMENTATION RATE: Sed Rate: 2 mm/h (ref 0–20)

## 2021-07-11 LAB — C-REACTIVE PROTEIN: CRP: 4.3 mg/L (ref <8.0)

## 2021-07-25 ENCOUNTER — Ambulatory Visit (HOSPITAL_BASED_OUTPATIENT_CLINIC_OR_DEPARTMENT_OTHER): Payer: BC Managed Care – PPO

## 2021-08-08 ENCOUNTER — Ambulatory Visit (HOSPITAL_BASED_OUTPATIENT_CLINIC_OR_DEPARTMENT_OTHER)
Admission: RE | Admit: 2021-08-08 | Discharge: 2021-08-08 | Disposition: A | Discharge status: Home / Self Care | Payer: BC Managed Care – PPO | Source: Ambulatory Visit | Attending: Family Medicine | Admitting: Family Medicine

## 2021-08-08 ENCOUNTER — Other Ambulatory Visit: Payer: Self-pay

## 2021-08-08 DIAGNOSIS — R519 Headache, unspecified: Secondary | ICD-10-CM | POA: Diagnosis not present

## 2021-08-08 DIAGNOSIS — R202 Paresthesia of skin: Secondary | ICD-10-CM | POA: Diagnosis not present

## 2021-08-08 MED ORDER — GADOBUTROL 1 MMOL/ML IV SOLN
9.0000 mL | Freq: Once | INTRAVENOUS | Status: AC | PRN
  Administered 2021-08-08: 9 mL via INTRAVENOUS

## 2021-08-10 ENCOUNTER — Ambulatory Visit: Payer: BC Managed Care – PPO | Admitting: Family Medicine

## 2021-08-17 NOTE — Addendum Note (Signed)
Addended by: Abelino Derrick A on: 08/17/2021 10:14 AM   Modules accepted: Orders

## 2021-09-07 ENCOUNTER — Ambulatory Visit: Payer: BC Managed Care – PPO | Admitting: Family Medicine

## 2021-10-13 ENCOUNTER — Encounter: Payer: Self-pay | Admitting: Family Medicine

## 2021-10-13 DIAGNOSIS — I10 Essential (primary) hypertension: Secondary | ICD-10-CM

## 2021-10-13 MED ORDER — CHLORTHALIDONE 25 MG PO TABS: 25.0000 mg | ORAL_TABLET | Freq: Every day | ORAL | 0 refills | Status: DC

## 2022-01-12 ENCOUNTER — Ambulatory Visit: Payer: BC Managed Care – PPO | Admitting: Family Medicine

## 2022-01-12 ENCOUNTER — Encounter: Payer: Self-pay | Admitting: Family Medicine

## 2022-01-12 VITALS — BP 150/88 | HR 74 | Temp 97.4°F | Ht 69.0 in | Wt 199.6 lb

## 2022-01-12 DIAGNOSIS — F439 Reaction to severe stress, unspecified: Secondary | ICD-10-CM

## 2022-01-12 DIAGNOSIS — R519 Headache, unspecified: Secondary | ICD-10-CM | POA: Diagnosis not present

## 2022-01-12 DIAGNOSIS — I10 Essential (primary) hypertension: Secondary | ICD-10-CM | POA: Diagnosis not present

## 2022-01-12 DIAGNOSIS — Z0000 Encounter for general adult medical examination without abnormal findings: Secondary | ICD-10-CM

## 2022-01-12 MED ORDER — AMLODIPINE BESYLATE 5 MG PO TABS: 5.0000 mg | ORAL_TABLET | Freq: Every day | ORAL | 1 refills | Status: DC

## 2022-01-12 MED ORDER — CHLORTHALIDONE 25 MG PO TABS: 25.0000 mg | ORAL_TABLET | Freq: Every day | ORAL | 0 refills | Status: DC

## 2022-01-12 MED ORDER — ZOLMITRIPTAN 2.5 MG PO TABS: 2.5000 mg | ORAL_TABLET | Freq: Once | ORAL | 0 refills | Status: DC

## 2022-01-12 NOTE — Progress Notes (Signed)
? ?Established Patient Office Visit ? ?Subjective:  ?Patient ID: John Chen, male    DOB: 11-Oct-1971  Age: 51 y.o. MRN: [id number] ? ?CC:  ?Chief Complaint  ?Patient presents with  ? Follow-up  ?  Follow up Pt c/o of headaches x 2 month. Pt. C/o stabbing pain in the colon area x 3 1/2 weeks.  ? ? ?HPI ?John Chen presents for follow-up of hypertension and headaches.  Took his last chlorthalidone yesterday.  Headaches have persisted.  They are as described at her last clinic visit.  Was unable to go for the neurology consultation secondary to finances.  His mom has been ill with cardiac issues.  He has been having to help him financially.  Reports stabbing pains in his lower abdominal area.  It feels as though he is constipated but he has not been.  Denies any blood or hematochezia.  No difficulties with urination.  No blood in the urine.  He does snore some.  No one is complaining.  Feels rested in the morning. ? ?Past Medical History:  ?Diagnosis Date  ? Hypertension   ? ? ?History reviewed. No pertinent surgical history. ? ?Family History  ?Problem Relation Age of Onset  ? Hypertension Mother   ? Hypertension Father   ? Asthma Sister   ? Hypertension Sister   ? Drug abuse Brother   ? Early death Brother   ? Heart attack Brother   ? Hypertension Maternal Grandmother   ? Asthma Sister   ? Alcohol abuse Neg Hx   ? Cancer Neg Hx   ? Diabetes Neg Hx   ? Hearing loss Neg Hx   ? Heart disease Neg Hx   ? Hyperlipidemia Neg Hx   ? Kidney disease Neg Hx   ? Stroke Neg Hx   ? ? ?Social History  ? ?Socioeconomic History  ? Marital status: Married  ?  Spouse name: Not on file  ? Number of children: Not on file  ? Years of education: Not on file  ? Highest education level: Not on file  ?Occupational History  ? Not on file  ?Tobacco Use  ? Smoking status: Never  ? Smokeless tobacco: Never  ?Vaping Use  ? Vaping Use: Never used  ?Substance and Sexual Activity  ? Alcohol use: Yes  ?  Alcohol/week: 2.0 standard drinks  ?   Types: 1 Cans of beer, 1 Glasses of wine per week  ? Drug use: No  ? Sexual activity: Yes  ?Other Topics Concern  ? Not on file  ?Social History Narrative  ? Not on file  ? ?Social Determinants of Health  ? ?Financial Resource Strain: Not on file  ?Food Insecurity: Not on file  ?Transportation Needs: Not on file  ?Physical Activity: Not on file  ?Stress: Not on file  ?Social Connections: Not on file  ?Intimate Partner Violence: Not on file  ? ? ?Outpatient Medications Prior to Visit  ?Medication Sig Dispense Refill  ? chlorthalidone (HYGROTON) 25 MG tablet Take 1 tablet (25 mg total) by mouth daily. 90 tablet 0  ? ?No facility-administered medications prior to visit.  ? ? ?No Known Allergies ? ?ROS ?Review of Systems  ?Constitutional:  Negative for chills, diaphoresis, fatigue, fever and unexpected weight change.  ?HENT: Negative.    ?Eyes:  Negative for photophobia and visual disturbance.  ?Respiratory: Negative.    ?Cardiovascular: Negative.   ?Gastrointestinal:  Positive for abdominal pain. Negative for anal bleeding, blood in stool, constipation, nausea and vomiting.  ?  Endocrine: Negative for polyphagia and polyuria.  ?Genitourinary:  Negative for difficulty urinating, dysuria, flank pain, frequency, hematuria and urgency.  ?Neurological:  Positive for headaches. Negative for speech difficulty and weakness.  ? ?  ?Objective:  ?  ?Physical Exam ?Vitals and nursing note reviewed.  ?Constitutional:   ?   General: He is not in acute distress. ?   Appearance: Normal appearance. He is not ill-appearing, toxic-appearing or diaphoretic.  ?HENT:  ?   Head: Normocephalic and atraumatic.  ?   Right Ear: External ear normal.  ?   Left Ear: External ear normal.  ?   Mouth/Throat:  ?   Mouth: Mucous membranes are moist.  ?   Pharynx: Oropharynx is clear. No oropharyngeal exudate or posterior oropharyngeal erythema.  ? ?Eyes:  ?   General:     ?   Right eye: No discharge.     ?   Left eye: No discharge.  ?   Extraocular  Movements: Extraocular movements intact.  ?   Conjunctiva/sclera: Conjunctivae normal.  ?   Pupils: Pupils are equal, round, and reactive to light.  ?Neck:  ?   Vascular: No carotid bruit.  ?Cardiovascular:  ?   Rate and Rhythm: Normal rate and regular rhythm.  ?Pulmonary:  ?   Effort: Pulmonary effort is normal.  ?   Breath sounds: Normal breath sounds.  ?Musculoskeletal:  ?   Cervical back: No rigidity or tenderness.  ?Lymphadenopathy:  ?   Cervical: No cervical adenopathy.  ?Neurological:  ?   Mental Status: He is alert and oriented to person, place, and time.  ?   Cranial Nerves: No dysarthria or facial asymmetry.  ?Psychiatric:     ?   Mood and Affect: Mood normal.     ?   Behavior: Behavior normal.  ? ? ?BP (!) 150/88 (BP Location: Left Arm, Patient Position: Sitting, Cuff Size: Normal)   Pulse 74   Temp (!) 97.4 ?F (36.3 ?C) (Temporal)   Ht $R'5\' 9"'Fk$  (1.753 m)   Wt 199 lb 9.6 oz (90.5 kg)   SpO2 95%   BMI 29.48 kg/m?  ?Wt Readings from Last 3 Encounters:  ?01/12/22 199 lb 9.6 oz (90.5 kg)  ?07/10/21 198 lb 9.6 oz (90.1 kg)  ?03/30/21 188 lb (85.3 kg)  ? ? ? ?Health Maintenance Due  ?Topic Date Due  ? Zoster Vaccines- Shingrix (1 of 2) Never done  ? COLONOSCOPY (Pts 45-77yrs Insurance coverage will need to be confirmed)  Never done  ? ? ?There are no preventive care reminders to display for this patient. ? ?Lab Results  ?Component Value Date  ? TSH 1.81 04/10/2019  ? ?Lab Results  ?Component Value Date  ? WBC 6.5 07/10/2021  ? HGB 14.1 07/10/2021  ? HCT 41.3 07/10/2021  ? MCV 89.4 07/10/2021  ? PLT 223 07/10/2021  ? ?Lab Results  ?Component Value Date  ? NA 140 07/10/2021  ? K 3.7 07/10/2021  ? CO2 28 07/10/2021  ? GLUCOSE 80 07/10/2021  ? BUN 20 07/10/2021  ? CREATININE 1.20 07/10/2021  ? BILITOT 0.5 03/17/2021  ? ALKPHOS 52 03/17/2021  ? AST 31 03/17/2021  ? ALT 37 03/17/2021  ? PROT 7.1 03/17/2021  ? ALBUMIN 4.6 03/17/2021  ? CALCIUM 10.2 07/10/2021  ? ANIONGAP 12 01/11/2021  ? EGFR 75 01/07/2021  ? GFR  72.58 01/01/2020  ? ?Lab Results  ?Component Value Date  ? CHOL 179 04/01/2021  ? ?Lab Results  ?Component Value Date  ?  HDL 45.30 04/01/2021  ? ?Lab Results  ?Component Value Date  ? Combes 94 01/01/2020  ? ?Lab Results  ?Component Value Date  ? TRIG 280.0 (H) 04/01/2021  ? ?Lab Results  ?Component Value Date  ? CHOLHDL 4 04/01/2021  ? ?No results found for: HGBA1C ? ?  ?Assessment & Plan:  ? ?Problem List Items Addressed This Visit   ? ?  ? Cardiovascular and Mediastinum  ? Essential hypertension  ? Relevant Medications  ? chlorthalidone (HYGROTON) 25 MG tablet  ? amLODipine (NORVASC) 5 MG tablet  ? Other Relevant Orders  ? CBC  ? Basic metabolic panel  ?  ? Other  ? Health care maintenance  ? Relevant Orders  ? Ambulatory referral to Gastroenterology  ? Nonintractable episodic headache - Primary  ? Relevant Medications  ? ZOLMitriptan (ZOMIG) 2.5 MG tablet  ? amLODipine (NORVASC) 5 MG tablet  ? Other Relevant Orders  ? CBC  ? Stress  ? ? ?Meds ordered this encounter  ?Medications  ? chlorthalidone (HYGROTON) 25 MG tablet  ?  Sig: Take 1 tablet (25 mg total) by mouth daily.  ?  Dispense:  90 tablet  ?  Refill:  0  ? ZOLMitriptan (ZOMIG) 2.5 MG tablet  ?  Sig: Take 1 tablet (2.5 mg total) by mouth once for 1 dose. May repeat in 2 hours if headache persists or recurs. No more than 2 each day.  ?  Dispense:  10 tablet  ?  Refill:  0  ? amLODipine (NORVASC) 5 MG tablet  ?  Sig: Take 1 tablet (5 mg total) by mouth daily.  ?  Dispense:  90 tablet  ?  Refill:  1  ? ? ?Follow-up: Return in about 3 months (around 04/14/2022).  ? ?Have added amlodipine for improved blood pressure control.  We will try Zomig as needed for headaches.  Information was given on managing hypertension and migraine headaches. ?Libby Maw, MD ?

## 2022-01-13 LAB — BASIC METABOLIC PANEL
BUN: 17 mg/dL (ref 6–23)
CO2: 29 mEq/L (ref 19–32)
Calcium: 9.9 mg/dL (ref 8.4–10.5)
Chloride: 99 mEq/L (ref 96–112)
Creatinine, Ser: 1.19 mg/dL (ref 0.40–1.50)
GFR: 70.38 mL/min (ref >60.00)
Glucose, Bld: 82 mg/dL (ref 70–99)
Potassium: 3.8 mEq/L (ref 3.5–5.1)
Sodium: 138 mEq/L (ref 135–145)

## 2022-01-13 LAB — CBC
HCT: 40.9 % (ref 39.0–52.0)
Hemoglobin: 14 g/dL (ref 13.0–17.0)
MCHC: 34.2 g/dL (ref 30.0–36.0)
MCV: 90.5 fl (ref 78.0–100.0)
Platelets: 206 10*3/uL (ref 150.0–400.0)
RBC: 4.52 Mil/uL (ref 4.22–5.81)
RDW: 12.3 % (ref 11.5–15.5)
WBC: 5.2 10*3/uL (ref 4.0–10.5)

## 2022-01-14 ENCOUNTER — Telehealth: Payer: Self-pay | Admitting: Family Medicine

## 2022-01-14 NOTE — Telephone Encounter (Signed)
Pt is asking for his [facility] referral be faxed back over. He says they have called him today 01/14/22, he missed his call. When he called back, he was told "no one from their office has called him".  I let him know it had been faxed over and I would ask the referral coordinator to resend it.  ?

## 2022-01-15 NOTE — Telephone Encounter (Signed)
I meant to send to the referral coordinator.  ?

## 2022-02-15 NOTE — Telephone Encounter (Signed)
Pt is checking on status of this referral.  ?Referred To    ?  [facility], it has been closed as of today.. he would like this reopened and sent back to [facility]. ? ?Please advise pt at  ? [phone] [facility])  ? ? ? ?

## 2022-02-17 ENCOUNTER — Encounter: Payer: Self-pay | Admitting: Gastroenterology

## 2022-02-17 NOTE — Telephone Encounter (Signed)
Spoke with patient who verbally understood he could give GI a call to schedule an appointment.  ?

## 2022-03-11 ENCOUNTER — Encounter: Payer: Self-pay | Admitting: Gastroenterology

## 2022-03-11 ENCOUNTER — Other Ambulatory Visit (INDEPENDENT_AMBULATORY_CARE_PROVIDER_SITE_OTHER): Payer: BC Managed Care – PPO

## 2022-03-11 ENCOUNTER — Ambulatory Visit (INDEPENDENT_AMBULATORY_CARE_PROVIDER_SITE_OTHER): Payer: BC Managed Care – PPO | Admitting: Gastroenterology

## 2022-03-11 VITALS — BP 126/82 | HR 98 | Ht 69.0 in | Wt 192.0 lb

## 2022-03-11 DIAGNOSIS — R1084 Generalized abdominal pain: Secondary | ICD-10-CM

## 2022-03-11 DIAGNOSIS — R140 Abdominal distension (gaseous): Secondary | ICD-10-CM | POA: Diagnosis not present

## 2022-03-11 LAB — C-REACTIVE PROTEIN: CRP: <1 mg/dL (ref 0.5–20.0)

## 2022-03-11 NOTE — Patient Instructions (Addendum)
If you are age 52 or older, your body mass index should be between 23-30. Your Body mass index is 28.35 kg/m. If this is out of the aforementioned range listed, please consider follow up with your Primary Care Provider.  If you are age 4 or younger, your body mass index should be between 19-25. Your Body mass index is 28.35 kg/m. If this is out of the aformentioned range listed, please consider follow up with your Primary Care Provider.   Please try IB guard samples.  Your provider has requested that you go to the basement level for lab work before leaving today. Press "B" on the elevator. The lab is located at the first door on the left as you exit the elevator.  The [facility] providers would like to encourage you to use [facility] to communicate with providers for non-urgent requests or questions.  Due to long hold times on the telephone, sending your provider a message by [facility] may be a faster and more efficient way to get a response.  Please allow 48 business hours for a response.  Please remember that this is for non-urgent requests.   Due to recent changes in healthcare laws, you may see the results of your imaging and laboratory studies on MyChart before your provider has had a chance to review them.  We understand that in some cases there may be results that are confusing or concerning to you. Not all laboratory results come back in the same time frame and the provider may be waiting for multiple results in order to interpret others.  Please give [place] 48 hours in order for your provider to thoroughly review all the results before contacting the office for clarification of your results.    It was a pleasure to see you today!  Thank you for trusting me with your gastrointestinal care!    Scott E.Candis Schatz, MD

## 2022-03-11 NOTE — Progress Notes (Signed)
HPI : John Chen is a very pleasant 51 year old male with hypertension who is referred to [place] by Dr. Abelino Derrick for further evaluation of abdominal pain.  Patient states that he has been having bothersome pain for several months now.  Pain is difficult to describe, but affects both the left lower and right lower quadrants.  Sometimes the pain seems to radiate into the back.  He says that he is having regular bowel movements, but he frequently feels like he has to defecate or has stool that needs to come out.  He reports bothersome bloating and abdominal distention.  He reports having regular bowel movements, with formed, solid soft stools.  Sometimes he has to strain with bowel movements, but this is not frequent.  He is not seeing any blood in the stool.  Diarrhea is not a problem for him. His abdominal discomfort seem to be worse when eating large amounts of meat, so he has made some dietary changes recently to reduce consumption of meats and increased consumption of fruits and vegetables.  He is concerned about colon cancer as a potential source of his symptoms.  He underwent a colonoscopy in 2016 to evaluate left lower abdominal pain.  The colonoscopy was normal.  He recalls that at that time he was undergoing significant stress related to financial issues.  He does admit that he is also under some significant stress recently.  He has no family history of colon cancer.   Past Medical History:  Diagnosis Date   Hypertension      Past Surgical History:  Procedure Laterality Date   NO PAST SURGERIES     Family History  Problem Relation Age of Onset   Hypertension Mother    Hypertension Father    Asthma Sister    Hypertension Sister    Asthma Sister    Drug abuse Brother    Early death Brother    Heart attack Brother    Hypertension Maternal Grandmother    Alcohol abuse Neg Hx    Cancer Neg Hx    Diabetes Neg Hx    Hearing loss Neg Hx    Heart disease Neg Hx     Hyperlipidemia Neg Hx    Kidney disease Neg Hx    Stroke Neg Hx    Colon cancer Neg Hx    Stomach cancer Neg Hx    Esophageal cancer Neg Hx    Colon polyps Neg Hx    Social History   Tobacco Use   Smoking status: Never   Smokeless tobacco: Never  Vaping Use   Vaping Use: Never used  Substance Use Topics   Alcohol use: Yes    Alcohol/week: 2.0 standard drinks    Types: 1 Cans of beer, 1 Glasses of wine per week   Drug use: No   Current Outpatient Medications  Medication Sig Dispense Refill   amLODipine (NORVASC) 5 MG tablet Take 1 tablet (5 mg total) by mouth daily. 90 tablet 1   chlorthalidone (HYGROTON) 25 MG tablet Take 1 tablet (25 mg total) by mouth daily. 90 tablet 0   ZOLMitriptan (ZOMIG) 2.5 MG tablet Take 1 tablet (2.5 mg total) by mouth once for 1 dose. May repeat in 2 hours if headache persists or recurs. No more than 2 each day. 10 tablet 0   No current facility-administered medications for this visit.   No Known Allergies   Review of Systems: All systems reviewed and negative except where noted in HPI.  No results found.  Physical Exam: BP 126/82   Pulse 98   Ht '5\' 9"'$  (1.753 m)   Wt 192 lb (87.1 kg)   SpO2 (!) 58%   BMI 28.35 kg/m  Constitutional: Pleasant,muscular, well-developed, African American male in no acute distress. HEENT: Normocephalic and atraumatic. Conjunctivae are normal. No scleral icterus. Neck supple.  Cardiovascular: Normal rate, regular rhythm.  Pulmonary/chest: Effort normal and breath sounds normal. No wheezing, rales or rhonchi. Abdominal: Soft, nondistended, mild tenderness to palpation in the bilateral lower quadrants.  No rigidity or guarding. Bowel sounds active throughout. There are no masses palpable. No hepatomegaly. Extremities: no edema Neurological: Alert and oriented to person place and time. Skin: Skin is warm and dry. No rashes noted. Psychiatric: Normal mood and affect. Behavior is normal.  CBC    Component  Value Date/Time   WBC 5.2 01/12/2022 1656   RBC 4.52 01/12/2022 1656   HGB 14.0 01/12/2022 1656   HGB 14.0 01/07/2021 1658   HGB 12.9 (L) 03/21/2008 1048   HCT 40.9 01/12/2022 1656   HCT 40.6 01/07/2021 1658   HCT 38.1 (L) 03/21/2008 1048   PLT 206.0 01/12/2022 1656   PLT 200 01/07/2021 1658   MCV 90.5 01/12/2022 1656   MCV 90 01/07/2021 1658   MCV 91.7 03/21/2008 1048   MCH 30.5 07/10/2021 1633   MCHC 34.2 01/12/2022 1656   RDW 12.3 01/12/2022 1656   RDW 12.2 01/07/2021 1658   RDW 13.1 03/21/2008 1048   LYMPHSABS 1.6 01/11/2021 1136   LYMPHSABS 1.8 01/07/2021 1658   LYMPHSABS 1.4 03/21/2008 1048   MONOABS 0.5 01/11/2021 1136   MONOABS 0.5 03/21/2008 1048   EOSABS 0.1 01/11/2021 1136   EOSABS 0.1 01/07/2021 1658   BASOSABS 0.1 01/11/2021 1136   BASOSABS 0.1 01/07/2021 1658   BASOSABS 0.0 03/21/2008 1048    CMP     Component Value Date/Time   NA 138 01/12/2022 1656   NA 142 01/07/2021 1658   K 3.8 01/12/2022 1656   CL 99 01/12/2022 1656   CO2 29 01/12/2022 1656   GLUCOSE 82 01/12/2022 1656   BUN 17 01/12/2022 1656   BUN 21 01/07/2021 1658   CREATININE 1.19 01/12/2022 1656   CREATININE 1.20 07/10/2021 1633   CALCIUM 9.9 01/12/2022 1656   PROT 7.1 03/17/2021 1544   PROT 7.6 01/07/2021 1658   ALBUMIN 4.6 03/17/2021 1544   ALBUMIN 5.1 (H) 01/07/2021 1658   AST 31 03/17/2021 1544   ALT 37 03/17/2021 1544   ALKPHOS 52 03/17/2021 1544   BILITOT 0.5 03/17/2021 1544   BILITOT 0.5 01/07/2021 1658   GFRNONAA >60 01/11/2021 1136     ASSESSMENT AND PLAN: John Chen is a very pleasant 51 year old male with chronic lower abdominal discomfort, bloating/distention and vague defecatory difficulties without red flag symptoms.  He underwent a colonoscopy in 2016 to evaluate abdominal pain which was normal.  Patient admits he was under significant stress around that time, and admits stress is also a significant problem for him currently. Although the patient is concerned  about colon cancer as a potential cause of his symptoms, I reassured him that his symptoms do not seem consistent with colon cancer, and that having a normal colonoscopy 6 years ago significantly reduces the likelihood even further.  I did not recommend repeating a colonoscopy at this time, but to reassure the patient I think a fecal occult blood test and checking inflammatory markers is reasonable.  We will also evaluate for H. pylori and celiac  disease as a cause of his abdominal pain.  If either his inflammatory markers or fecal occult blood test are abnormal, we will proceed with colonoscopy. We discussed the proposed pathophysiology of gut brain axis disorders and the role of stress and anxiety contributing to abdominal symptoms.  Some of his symptoms may be related to the changes in his diet, increasing fruits and vegetables which made increased intestinal gas and bloating.  I suggested a trial of IBgard to see if this can reduce some of his discomfort. The patient was appreciative of the reassurance and assistance.  Abdominal pain, bloating, suspect functional etiology - Fecal occult blood test - CRP - H. pylori stool antigen - TTG/IgA - IBgard as needed for abdominal pain  Haille Pardi E. Candis Schatz, MD [facility]  CC:  Libby Maw,*

## 2022-03-12 LAB — TISSUE TRANSGLUTAMINASE, IGA: (tTG) Ab, IgA: <1 U/mL

## 2022-03-12 LAB — IGA: Immunoglobulin A: 251 mg/dL (ref 47–310)

## 2022-03-12 NOTE — Progress Notes (Signed)
John Chen,  The lab test for celiac disease and the inflammatory marker were normal.  Still awaiting stool results.

## 2022-04-19 ENCOUNTER — Other Ambulatory Visit: Payer: Self-pay | Admitting: Family Medicine

## 2022-04-19 DIAGNOSIS — I10 Essential (primary) hypertension: Secondary | ICD-10-CM

## 2022-06-10 ENCOUNTER — Ambulatory Visit: Payer: BC Managed Care – PPO | Admitting: Family Medicine

## 2022-06-10 ENCOUNTER — Ambulatory Visit (INDEPENDENT_AMBULATORY_CARE_PROVIDER_SITE_OTHER): Payer: BC Managed Care – PPO

## 2022-06-10 ENCOUNTER — Encounter: Payer: Self-pay | Admitting: Family Medicine

## 2022-06-10 VITALS — BP 115/68 | HR 71 | Temp 97.6°F | Ht 69.0 in | Wt 198.0 lb

## 2022-06-10 DIAGNOSIS — Z1211 Encounter for screening for malignant neoplasm of colon: Secondary | ICD-10-CM

## 2022-06-10 DIAGNOSIS — J22 Unspecified acute lower respiratory infection: Secondary | ICD-10-CM | POA: Insufficient documentation

## 2022-06-10 DIAGNOSIS — R059 Cough, unspecified: Secondary | ICD-10-CM | POA: Diagnosis not present

## 2022-06-10 DIAGNOSIS — J45909 Unspecified asthma, uncomplicated: Secondary | ICD-10-CM | POA: Insufficient documentation

## 2022-06-10 DIAGNOSIS — J4521 Mild intermittent asthma with (acute) exacerbation: Secondary | ICD-10-CM

## 2022-06-10 DIAGNOSIS — S161XXA Strain of muscle, fascia and tendon at neck level, initial encounter: Secondary | ICD-10-CM | POA: Insufficient documentation

## 2022-06-10 MED ORDER — AZITHROMYCIN 250 MG PO TABS: ORAL_TABLET | ORAL | 0 refills | Status: AC

## 2022-06-10 MED ORDER — PREDNISONE 20 MG PO TABS: 20.0000 mg | ORAL_TABLET | Freq: Two times a day (BID) | ORAL | 0 refills | Status: AC

## 2022-06-10 NOTE — Progress Notes (Signed)
Established Patient Office Visit  Subjective   Patient ID: John Chen, male    DOB: 1971-02-26  Age: 51 y.o. MRN: [id number]  Chief Complaint  Patient presents with   Pain    Neck and shoulder pain x 1 week, sore throat x 3 weeks.     HPI 3-week history of nasal congestion postnasal drip and cough with some tightness in the chest.  Denies wheezing or asthma history.  He does not smoke.  Denies sneezing itchy watery eyes ears nose or throat.  He has felt warm but has not run a fever.  With this he has had pain and stiffness in his neck and shoulders.  Rotation through his neck occasionally produces a sharp pain.    Review of Systems  Constitutional: Negative.   HENT:  Positive for congestion.   Eyes:  Negative for blurred vision, discharge and redness.  Respiratory:  Positive for cough and sputum production. Negative for shortness of breath and wheezing.   Cardiovascular: Negative.   Gastrointestinal:  Negative for abdominal pain.  Genitourinary: Negative.   Musculoskeletal: Negative.  Negative for myalgias.  Skin:  Negative for rash.  Neurological:  Negative for tingling, loss of consciousness and weakness.  Endo/Heme/Allergies:  Negative for polydipsia.      Objective:     BP 115/68 (BP Location: Right Arm, Patient Position: Sitting, Cuff Size: Large)   Pulse 71   Temp 97.6 F (36.4 C) (Temporal)   Ht '5\' 9"'$  (1.753 m)   Wt 198 lb (89.8 kg)   SpO2 98%   BMI 29.24 kg/m    Physical Exam Constitutional:      General: He is not in acute distress.    Appearance: Normal appearance. He is not ill-appearing, toxic-appearing or diaphoretic.  HENT:     Head: Normocephalic and atraumatic.     Right Ear: External ear normal.     Left Ear: External ear normal.     Mouth/Throat:     Mouth: Mucous membranes are moist.     Pharynx: Oropharynx is clear. No oropharyngeal exudate or posterior oropharyngeal erythema.  Eyes:     General: No scleral icterus.       Right eye: No  discharge.        Left eye: No discharge.     Extraocular Movements: Extraocular movements intact.     Conjunctiva/sclera: Conjunctivae normal.     Pupils: Pupils are equal, round, and reactive to light.  Cardiovascular:     Rate and Rhythm: Normal rate and regular rhythm.  Pulmonary:     Effort: Pulmonary effort is normal. No respiratory distress.     Breath sounds: Normal breath sounds. No wheezing or rales.  Abdominal:     General: Bowel sounds are normal.     Tenderness: There is no abdominal tenderness. There is no guarding.  Musculoskeletal:     Right shoulder: No tenderness. Normal range of motion.     Left shoulder: No tenderness. Normal range of motion.     Cervical back: No rigidity, tenderness or bony tenderness. No pain with movement. Normal range of motion.  Skin:    General: Skin is warm and dry.  Neurological:     Mental Status: He is alert and oriented to person, place, and time.  Psychiatric:        Mood and Affect: Mood normal.        Behavior: Behavior normal.      No results found for any visits on 06/10/22.  The 10-year ASCVD risk score (Arnett DK, et al., 2019) is: 7.9%    Assessment & Plan:   Problem List Items Addressed This Visit       Respiratory   Lower respiratory infection - Primary   Relevant Medications   azithromycin (ZITHROMAX) 250 MG tablet   Other Relevant Orders   DG Chest 2 View   Reactive airway disease   Relevant Medications   predniSONE (DELTASONE) 20 MG tablet     Musculoskeletal and Integument   Cervical strain   Relevant Medications   predniSONE (DELTASONE) 20 MG tablet     Other   Screen for colon cancer   Relevant Orders   Ambulatory referral to Gastroenterology    No follow-ups on file.  Reevaluation if neck pain does not improve.  Requested referral for colonoscopy.  Advised patient to return for physical fasting.  Libby Maw, MD

## 2022-06-10 NOTE — Progress Notes (Unsigned)
Initial visit for 3 week hx of nasal congestion, postnasal drip and cough. Feels some tightness in his chest.  Never a smoker.

## 2022-07-26 ENCOUNTER — Encounter (HOSPITAL_COMMUNITY): Payer: Self-pay

## 2022-07-26 ENCOUNTER — Emergency Department (HOSPITAL_COMMUNITY): Payer: BC Managed Care – PPO

## 2022-07-26 ENCOUNTER — Emergency Department (HOSPITAL_COMMUNITY)
Admission: EM | Admit: 2022-07-26 | Discharge: 2022-07-26 | Disposition: A | Discharge status: Home / Self Care | Payer: BC Managed Care – PPO | Attending: Emergency Medicine | Admitting: Emergency Medicine

## 2022-07-26 ENCOUNTER — Other Ambulatory Visit: Payer: Self-pay

## 2022-07-26 DIAGNOSIS — S3992XA Unspecified injury of lower back, initial encounter: Secondary | ICD-10-CM | POA: Diagnosis not present

## 2022-07-26 DIAGNOSIS — M5136 Other intervertebral disc degeneration, lumbar region: Secondary | ICD-10-CM | POA: Insufficient documentation

## 2022-07-26 DIAGNOSIS — I10 Essential (primary) hypertension: Secondary | ICD-10-CM | POA: Diagnosis not present

## 2022-07-26 DIAGNOSIS — M47816 Spondylosis without myelopathy or radiculopathy, lumbar region: Secondary | ICD-10-CM | POA: Diagnosis not present

## 2022-07-26 DIAGNOSIS — Z79899 Other long term (current) drug therapy: Secondary | ICD-10-CM | POA: Insufficient documentation

## 2022-07-26 DIAGNOSIS — S39012A Strain of muscle, fascia and tendon of lower back, initial encounter: Secondary | ICD-10-CM | POA: Diagnosis not present

## 2022-07-26 DIAGNOSIS — Y92410 Unspecified street and highway as the place of occurrence of the external cause: Secondary | ICD-10-CM | POA: Insufficient documentation

## 2022-07-26 DIAGNOSIS — M545 Low back pain, unspecified: Secondary | ICD-10-CM | POA: Diagnosis not present

## 2022-07-26 MED ORDER — PREDNISONE 20 MG PO TABS: ORAL_TABLET | ORAL | 0 refills | Status: DC

## 2022-07-26 MED ORDER — IBUPROFEN 400 MG PO TABS
600.0000 mg | ORAL_TABLET | Freq: Once | ORAL | Status: DC
  Filled 2022-07-26: qty 1

## 2022-07-26 MED ORDER — OXYCODONE-ACETAMINOPHEN 5-325 MG PO TABS
1.0000 | ORAL_TABLET | Freq: Once | ORAL | Status: AC
  Administered 2022-07-26: 1 via ORAL
  Filled 2022-07-26: qty 1

## 2022-07-26 MED ORDER — METHOCARBAMOL 750 MG PO TABS: 750.0000 mg | ORAL_TABLET | Freq: Three times a day (TID) | ORAL | 0 refills | Status: DC | PRN

## 2022-07-26 MED ORDER — TRAMADOL HCL 50 MG PO TABS: 50.0000 mg | ORAL_TABLET | Freq: Four times a day (QID) | ORAL | 0 refills | Status: DC | PRN

## 2022-07-26 MED ORDER — PREDNISONE 20 MG PO TABS
60.0000 mg | ORAL_TABLET | Freq: Once | ORAL | Status: AC
  Administered 2022-07-26: 60 mg via ORAL
  Filled 2022-07-26: qty 3
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
## 2022-07-26 NOTE — Discharge Instructions (Addendum)
It was our pleasure to provide your ER care today - we hope that you feel better.  Avoid bending at waist or heavy lifting > 20 lbs for the next week.  Try heat therapy and/or gentle massage to sore area.   Take prednisone as prescribed. You may try robaxin as need for muscle spasm, and ultram as need for pain - no driving when taking these meds.   Follow up with primary care doctor and physical therapy this coming week. Your blood pressure is high today - follow up with primary care doctor.   Also follow up with spine specialist in 2-3 weeks if symptoms fail to improve/resolve.  Return to ER if worse, new symptoms, new/severe  or intractable pain, numbness/weakness, or other concern.

## 2022-07-26 NOTE — ED Notes (Signed)
Pt verbalized understanding of d/c instructions, meds, and followup care. Denies questions. VSS, no distress noted. Steady gait to exit with all belongings.  ?
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
## 2022-07-28 ENCOUNTER — Other Ambulatory Visit: Payer: Self-pay

## 2022-07-28 DIAGNOSIS — I10 Essential (primary) hypertension: Secondary | ICD-10-CM

## 2022-07-28 MED ORDER — AMLODIPINE BESYLATE 5 MG PO TABS: 5.0000 mg | ORAL_TABLET | Freq: Every day | ORAL | 1 refills | Status: DC

## 2022-07-28 MED ORDER — CHLORTHALIDONE 25 MG PO TABS: 25.0000 mg | ORAL_TABLET | Freq: Every day | ORAL | 0 refills | Status: DC

## 2022-07-30 ENCOUNTER — Encounter: Payer: Self-pay | Admitting: Family Medicine

## 2022-07-30 ENCOUNTER — Ambulatory Visit: Payer: BC Managed Care – PPO | Admitting: Family Medicine

## 2022-07-30 VITALS — BP 126/82 | HR 73 | Temp 97.5°F | Ht 69.0 in | Wt 181.6 lb

## 2022-07-30 DIAGNOSIS — M5442 Lumbago with sciatica, left side: Secondary | ICD-10-CM | POA: Diagnosis not present

## 2022-07-30 MED ORDER — MELOXICAM 15 MG PO TABS: 15.0000 mg | ORAL_TABLET | Freq: Every day | ORAL | 0 refills | Status: DC

## 2022-07-30 MED ORDER — GABAPENTIN 400 MG PO CAPS: ORAL_CAPSULE | ORAL | 0 refills | Status: DC

## 2022-07-30 NOTE — Progress Notes (Signed)
qqqq
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
## 2022-08-09 ENCOUNTER — Encounter: Payer: Self-pay | Admitting: Family Medicine

## 2022-08-10 ENCOUNTER — Telehealth: Payer: Self-pay | Admitting: Family Medicine

## 2022-08-10 NOTE — Telephone Encounter (Signed)
Pt has brought FMLA paperwork for Dr. Ethelene Hal to fill out. He is saying he needs this back as soon as possible. I have placed in his folder up front. Pt would like a call when completed, he will come by and pick up. Pt @ [phone]

## 2022-08-11 ENCOUNTER — Telehealth: Payer: Self-pay | Admitting: Family Medicine

## 2022-08-11 NOTE — Telephone Encounter (Signed)
Pt stated that he dropped off paperwork to be filled out and wanted to know if they are ready yet FMLA and SHORT TERM paperwork. Please call the pt

## 2022-08-16 DIAGNOSIS — M545 Low back pain, unspecified: Secondary | ICD-10-CM | POA: Diagnosis not present

## 2022-08-16 NOTE — Telephone Encounter (Signed)
Per Dr. Ethelene Hal forms filled out again patient aware and states he will stop by the office to pick up.

## 2022-08-16 NOTE — Telephone Encounter (Signed)
Duplicate

## 2022-08-16 NOTE — Telephone Encounter (Signed)
Per Dr. Ethelene Hal he have filled out FMLA forms for patient, have checked with other staff to see if they have seen the forms that were previously filled out no luck with that. I called the patient and explained this to him and let patient know that I will let Dr. Ethelene Hal know that the forms that was dropped off on the 17th was the only forms that patient have dropped off and see if he will fill them out today. Will call patient back with updates.

## 2022-08-16 NOTE — Telephone Encounter (Signed)
Duplicate note - see other tel enc

## 2022-08-16 NOTE — Telephone Encounter (Signed)
Caller Name: Rodolphe Edmonston Call back phone #: [phone]  Reason for Call: Pt called urging that he needs the paperwork. Please call with an update

## 2022-08-16 NOTE — Telephone Encounter (Signed)
Caller Name: Buckley Bradly  Call back phone #: [phone]  Reason for Call: pt called stating 4th call w/o response. He states he needs to know if paperwork has been completed. He has to pick up and return to employer. Pt needing urgent call back.   I am not able to f/u due to being in staffing so have added clinical supervisor.

## 2022-08-17 ENCOUNTER — Telehealth: Payer: Self-pay

## 2022-08-17 NOTE — Telephone Encounter (Signed)
Spoke with patient went over paperwork and patients request to have dates changed to 11/15 when patient will be finished with PT. Per Dr. Ethelene Hal he will not change dates due to last note stating for patient to be out until 10/13 or 14th Dr. Ethelene Hal did not refer patient to PT and states that they should write for patient to be out for extended dates. Patient agrees to check with PT to see if they will write for him to be out.

## 2022-08-17 NOTE — Telephone Encounter (Addendum)
Patient came by the office to pick up the short term disability paperwork. 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 Patient does not finish physical therapy until 11/15. His caseworker recommended that we correct this so he can get approved. Have made a copy of PT appointments as well as the disability paperwork, placed in providers folder up front.

## 2022-08-17 NOTE — Telephone Encounter (Signed)
Error

## 2022-08-17 NOTE — Telephone Encounter (Signed)
Caller Name: Laterrance Nauta Call back phone #: [phone]  Reason for Call: Pt came to the office to pick up forms. He was advised by specialist to extend time out of work. Forms should've been faxed to our office. Please look over forms and call pt back

## 2022-08-19 DIAGNOSIS — S39012A Strain of muscle, fascia and tendon of lower back, initial encounter: Secondary | ICD-10-CM | POA: Diagnosis not present

## 2022-08-25 DIAGNOSIS — M545 Low back pain, unspecified: Secondary | ICD-10-CM | POA: Diagnosis not present

## 2022-08-26 NOTE — Telephone Encounter (Signed)
error 

## 2022-09-22 DIAGNOSIS — S39012A Strain of muscle, fascia and tendon of lower back, initial encounter: Secondary | ICD-10-CM | POA: Diagnosis not present

## 2022-09-22 DIAGNOSIS — Z6827 Body mass index (BMI) 27.0-27.9, adult: Secondary | ICD-10-CM | POA: Diagnosis not present

## 2022-12-16 ENCOUNTER — Other Ambulatory Visit: Payer: Self-pay | Admitting: Family Medicine

## 2022-12-16 DIAGNOSIS — I10 Essential (primary) hypertension: Secondary | ICD-10-CM

## 2022-12-16 MED ORDER — CHLORTHALIDONE 25 MG PO TABS: 25.0000 mg | ORAL_TABLET | Freq: Every day | ORAL | 0 refills | Status: DC

## 2022-12-22 ENCOUNTER — Telehealth: Payer: Self-pay | Admitting: Family Medicine

## 2022-12-22 DIAGNOSIS — I10 Essential (primary) hypertension: Secondary | ICD-10-CM

## 2022-12-22 MED ORDER — AMLODIPINE BESYLATE 5 MG PO TABS: 5.0000 mg | ORAL_TABLET | Freq: Every day | ORAL | 1 refills | Status: DC

## 2022-12-22 NOTE — Telephone Encounter (Signed)
Prescription Request  12/22/2022   LOV: 07/30/2022  What is the name of the medication or equipment? amLODipine (NORVASC) 5 MG tablet [id number]   Have you contacted your pharmacy to request a refill? Yes   Which pharmacy would you like this sent to?  [address], [place] [address] Phone: [phone] Fax: [phone]    Patient notified that their request is being sent to the clinical staff for review and that they should receive a response within 2 business days.   Please advise at Mobile [phone] (mobile)

## 2023-01-31 ENCOUNTER — Telehealth: Payer: Self-pay | Admitting: Family Medicine

## 2023-01-31 NOTE — Telephone Encounter (Signed)
Caller Name: John Chen Call back phone #: [phone]  Reason for Call:  Pt called with chest pains. He stated that after the pain subsides he feels kind of sick. I tried to send to NT but he refused. KO suggested that Tequila speak to him but he hung up before she could take the call.

## 2023-01-31 NOTE — Telephone Encounter (Signed)
Returned patients call per patient he does not currently have chest pains he states that he mentioned this on the call because it had happened in the past patient calling for an appointment due to continued sore throat issues not sure the cause of it but patient wanting to schedule for this. Patient verbally understood Dr. Doreene Burke did not have any available appointments for the requested day of Wednesday afternoon. Advised patient to give [place] a call on the day that he is asking to be seen to be scheduled with one of the other providers. Patient agrees and and also agrees to go to ER if symptoms becomes worse or if chest pains reoccur.

## 2023-02-02 ENCOUNTER — Ambulatory Visit (INDEPENDENT_AMBULATORY_CARE_PROVIDER_SITE_OTHER): Payer: PRIVATE HEALTH INSURANCE | Admitting: Nurse Practitioner

## 2023-02-02 ENCOUNTER — Ambulatory Visit: Payer: BC Managed Care – PPO | Admitting: Nurse Practitioner

## 2023-02-02 ENCOUNTER — Encounter: Payer: Self-pay | Admitting: Nurse Practitioner

## 2023-02-02 VITALS — BP 132/88 | HR 63 | Temp 98.1°F | Ht 69.0 in | Wt 190.4 lb

## 2023-02-02 DIAGNOSIS — R079 Chest pain, unspecified: Secondary | ICD-10-CM | POA: Insufficient documentation

## 2023-02-02 DIAGNOSIS — R42 Dizziness and giddiness: Secondary | ICD-10-CM

## 2023-02-02 DIAGNOSIS — H6123 Impacted cerumen, bilateral: Secondary | ICD-10-CM | POA: Diagnosis not present

## 2023-02-02 DIAGNOSIS — I10 Essential (primary) hypertension: Secondary | ICD-10-CM | POA: Diagnosis not present

## 2023-02-02 DIAGNOSIS — R040 Epistaxis: Secondary | ICD-10-CM

## 2023-02-02 NOTE — Patient Instructions (Signed)
It was great to see you!  Start using nasal saline spray, 3-4 times per day. You can also use a humidifier at night.   We are checking your labs today and will let you know the results via mychart/phone.   I am placing a referral cardiology.  You can use debrox over the counter ear drops as needed for ear wax. If you feel your ears blocking up, place 5-10 drops in your ear and let sit for about 10 minutes.   Let's follow-up in 4 weeks, sooner if you have concerns.  If a referral was placed today, you will be contacted for an appointment. Please note that routine referrals can sometimes take up to 3-4 weeks to process. Please call our office if you haven't heard anything after this time frame.  Take care,  Rodman Pickle, NP

## 2023-02-02 NOTE — Assessment & Plan Note (Signed)
He had an episode of left sided chest pain on Monday that radiated to his left neck and shoulder. It was sharp and only lasted about 10 minutes and then went away. He has had muscular chest pain before, however this felt different. EKG done which showed normal sinus rhythm that was similar to prior EKG 4 years ago. Will check CMP, CBC, lipid panel today and referral placed to cardiology.

## 2023-02-02 NOTE — Assessment & Plan Note (Signed)
He has been experiencing sore throat and nasal dryness with nosebleeds for the last 2 months. Encouraged him to start nasal saline spray 4-5 times per day and to use a humidifier at night. Check CBC today. Follow-up in 4 weeks.

## 2023-02-02 NOTE — Progress Notes (Signed)
EKG interpreted by me on 02/02/23 showed sinus bradycardia with a heart rate of 57. Similar to prior EKG.

## 2023-02-02 NOTE — Assessment & Plan Note (Signed)
He has been experiencing intermittent dizziness for the last few weeks. He states that sometimes he will get white spots in his vision. Eating helps his symptoms. No red flags on exam today. Will check CMP, CBC, TSH, and A1c today. Encouraged him to start drinking more water and limiting caffeine. Follow-up in 4 weeks.

## 2023-02-02 NOTE — Progress Notes (Signed)
Acute Office Visit  Subjective:     Patient ID: John Chen, male    DOB: 02-Sep-1971, 52 y.o.   MRN: [id number]  Chief Complaint  Patient presents with   Sore Throat    Constant for 2 months with dryness, dryness in nostrils with nosebleeds, headaches, chest congestion, sees white spots with lightheaded daily    HPI Patient is in today for sore throat with dryness for the last few months. It started off as an itch and when he wakes up in the morning, his symptoms are the worst. He feels like he has a dry throat and nostrils and he needs to drink water right away. 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 He states that his left nostril is bloody in the mornings as well. He states that his throat hurts to talk and sing at times. He has been experiencing frequent headaches. He has also been experiencing some dizziness and getting white spots in his vision which block his vision. He states that he will eat something which helps the vision. He also had an episode of left sided chest pain Monday morning. This lasted about a few minutes and radiated up to his left neck and shoulder. He does not check his blood pressure at home. He has not been taking any OTC medications. He usually drinks 1 energy drink at lunch and sometimes coffee in the mornings.   He also notes that his right ear has felt blocked and has had trouble hearing for the last year. He denies fevers and has been told before that he had ear wax build up.   ROS See pertinent positives and negatives per HPI.     Objective:    BP 132/88 (BP Location: Right Arm, Cuff Size: Large)   Pulse 63   Temp 98.1 F (36.7 C)   Ht 5\' 9"  (1.753 m)   Wt 190 lb 6.4 oz (86.4 kg)   SpO2 97%   BMI 28.12 kg/m     Physical Exam Vitals and nursing note reviewed.  Constitutional:      Appearance: Normal appearance.  HENT:     Head: Normocephalic.     Right Ear: External ear normal. There is impacted cerumen.     Left Ear: External ear normal.  There is impacted cerumen.  Eyes:     Conjunctiva/sclera: Conjunctivae normal.  Cardiovascular:     Rate and Rhythm: Normal rate and regular rhythm.     Pulses: Normal pulses.     Heart sounds: Normal heart sounds.  Pulmonary:     Effort: Pulmonary effort is normal.     Breath sounds: Normal breath sounds.  Musculoskeletal:     Cervical back: Normal range of motion and neck supple. No tenderness.  Lymphadenopathy:     Cervical: No cervical adenopathy.  Skin:    General: Skin is warm.  Neurological:     General: No focal deficit present.     Mental Status: He is alert and oriented to person, place, and time.  Psychiatric:        Mood and Affect: Mood normal.        Behavior: Behavior normal.        Thought Content: Thought content normal.        Judgment: Judgment normal.       Assessment & Plan:   Problem List Items Addressed This Visit       Cardiovascular and Mediastinum   Essential hypertension - Primary  Chronic, stable. BP today 132/88. Continue amlodipine 5mg  daily and chlorthalidone 25mg  daily. Check CMP, CBC, lipid panel today.       Relevant Orders   CBC   Comprehensive metabolic panel   Lipid panel     Other   Dizziness    He has been experiencing intermittent dizziness for the last few weeks. He states that sometimes he will get white spots in his vision. Eating helps his symptoms. No red flags on exam today. Will check CMP, CBC, TSH, and A1c today. Encouraged him to start drinking more water and limiting caffeine. Follow-up in 4 weeks.       Relevant Orders   CBC   Comprehensive metabolic panel   Hemoglobin A1c   TSH   Chest pain    He had an episode of left sided chest pain on Monday that radiated to his left neck and shoulder. It was sharp and only lasted about 10 minutes and then went away. He has had muscular chest pain before, however this felt different. EKG done which showed normal sinus rhythm that was similar to prior EKG 4 years ago. Will  check CMP, CBC, lipid panel today and referral placed to cardiology.       Relevant Orders   EKG 12-Lead (Completed)   Ambulatory referral to Cardiology   Nosebleed    He has been experiencing sore throat and nasal dryness with nosebleeds for the last 2 months. Encouraged him to start nasal saline spray 4-5 times per day and to use a humidifier at night. Check CBC today. Follow-up in 4 weeks.       Other Visit Diagnoses     Bilateral impacted cerumen       After obtaining verbal consent, bilateral ears irrigated with good results. He tolerated the procedure well.       No orders of the defined types were placed in this encounter.   Return in about 4 weeks (around 03/02/2023) for HTN, dizziness.  Gerre Scull, NP

## 2023-02-02 NOTE — Assessment & Plan Note (Signed)
Chronic, stable. BP today 132/88. Continue amlodipine 5mg  daily and chlorthalidone 25mg  daily. Check CMP, CBC, lipid panel today.

## 2023-02-03 ENCOUNTER — Telehealth: Payer: Self-pay | Admitting: Family Medicine

## 2023-02-03 LAB — COMPREHENSIVE METABOLIC PANEL
ALT: 46 U/L (ref 0–53)
AST: 72 U/L — ABNORMAL HIGH (ref 0–37)
Albumin: 4.7 g/dL (ref 3.5–5.2)
Alkaline Phosphatase: 44 U/L (ref 39–117)
BUN: 18 mg/dL (ref 6–23)
CO2: 27 mEq/L (ref 19–32)
Calcium: 10.1 mg/dL (ref 8.4–10.5)
Chloride: 99 mEq/L (ref 96–112)
Creatinine, Ser: 1.52 mg/dL — ABNORMAL HIGH (ref 0.40–1.50)
GFR: 52.08 mL/min — ABNORMAL LOW (ref >60.00)
Glucose, Bld: 87 mg/dL (ref 70–99)
Potassium: 3.9 mEq/L (ref 3.5–5.1)
Sodium: 138 mEq/L (ref 135–145)
Total Bilirubin: 0.7 mg/dL (ref 0.2–1.2)
Total Protein: 7.4 g/dL (ref 6.0–8.3)

## 2023-02-03 LAB — CBC
HCT: 44.7 % (ref 39.0–52.0)
Hemoglobin: 15.1 g/dL (ref 13.0–17.0)
MCHC: 33.7 g/dL (ref 30.0–36.0)
MCV: 91.9 fl (ref 78.0–100.0)
Platelets: 219 10*3/uL (ref 150.0–400.0)
RBC: 4.86 Mil/uL (ref 4.22–5.81)
RDW: 12.3 % (ref 11.5–15.5)
WBC: 5.2 10*3/uL (ref 4.0–10.5)

## 2023-02-03 LAB — LIPID PANEL
Cholesterol: 146 mg/dL (ref 0–200)
HDL: 52 mg/dL (ref >39.00)
NonHDL: 93.57
Total CHOL/HDL Ratio: 3
Triglycerides: 239 mg/dL — ABNORMAL HIGH (ref 0.0–149.0)
VLDL: 47.8 mg/dL — ABNORMAL HIGH (ref 0.0–40.0)

## 2023-02-03 LAB — TSH: TSH: 1.18 u[IU]/mL (ref 0.35–5.50)

## 2023-02-03 LAB — LDL CHOLESTEROL, DIRECT: Direct LDL: 65 mg/dL

## 2023-02-03 LAB — HEMOGLOBIN A1C: Hgb A1c MFr Bld: 4.7 % (ref 4.6–6.5)

## 2023-02-03 NOTE — Telephone Encounter (Signed)
Pt requesting TOC to Lauren. Pt felt a good connection with her. Please advise.

## 2023-02-07 ENCOUNTER — Encounter: Payer: Self-pay | Admitting: *Deleted

## 2023-03-04 ENCOUNTER — Encounter: Payer: PRIVATE HEALTH INSURANCE | Admitting: Nurse Practitioner

## 2023-03-04 ENCOUNTER — Telehealth: Payer: Self-pay | Admitting: Nurse Practitioner

## 2023-03-04 ENCOUNTER — Encounter: Payer: PRIVATE HEALTH INSURANCE | Admitting: Family Medicine

## 2023-03-04 NOTE — Telephone Encounter (Signed)
Pt came in early at 2:20. He had wrong time and not able to wait. Rescheduled for 04/01/2023.

## 2023-03-04 NOTE — Telephone Encounter (Signed)
Noted  

## 2023-03-07 ENCOUNTER — Encounter: Payer: Self-pay | Admitting: Internal Medicine

## 2023-03-07 ENCOUNTER — Ambulatory Visit: Payer: PRIVATE HEALTH INSURANCE | Attending: Internal Medicine | Admitting: Internal Medicine

## 2023-03-07 VITALS — BP 138/86 | HR 56 | Ht 69.0 in | Wt 189.8 lb

## 2023-03-07 DIAGNOSIS — I10 Essential (primary) hypertension: Secondary | ICD-10-CM

## 2023-03-07 DIAGNOSIS — Z79899 Other long term (current) drug therapy: Secondary | ICD-10-CM | POA: Diagnosis not present

## 2023-03-07 DIAGNOSIS — R072 Precordial pain: Secondary | ICD-10-CM

## 2023-03-07 MED ORDER — AMLODIPINE BESYLATE 10 MG PO TABS: 10.0000 mg | ORAL_TABLET | Freq: Every day | ORAL | 3 refills | Status: DC

## 2023-03-07 NOTE — Patient Instructions (Addendum)
Medication Instructions:  INCREASE AMLODIPINE TO 10mg  DAILY  *If you need a refill on your cardiac medications before your next appointment, please call your pharmacy*  Lab Work: Please return for Blood Work ONE WEEK PRIOR TO CCTA SCAN. No appointment needed, lab here at the office is open Monday-Friday from 8AM to 4PM and closed daily for lunch from 12:45-1:45.   If you have labs (blood work) drawn today and your tests are completely normal, you will receive your results only by: MyChart Message (if you have MyChart) OR A paper copy in the mail If you have any lab test that is abnormal or we need to change your treatment, we will call you to review the results.  Testing/Procedures: Your physician has requested that you have cardiac CT. Cardiac computed tomography (CT) is a painless test that uses an x-ray machine to take clear, detailed pictures of your heart. For further information please visit [email or web address] Please follow instruction sheet as given.    Your cardiac CT will be scheduled at one of the below locations:   [facility] [address] [place], [place] [address] [phone]  If scheduled at [facility], please arrive at the [facility] and Children's Entrance (Entrance C2) of [facility] 30 minutes prior to test start time. You can use the FREE valet parking offered at entrance C (encouraged to control the heart rate for the test)  Proceed to the [facility] Radiology Department (first floor) to check-in and test prep.  All radiology patients and guests should use entrance C2 at [facility], accessed from [facility], even though the hospital's physical address listed is [address]    Please follow these instructions carefully (unless otherwise directed):  Hold all erectile dysfunction medications at least 3 days (72 hrs) prior to test. (Ie viagra, cialis, sildenafil, tadalafil, etc) We will administer  nitroglycerin during this exam.   On the Night Before the Test: Be sure to Drink plenty of water. Do not consume any caffeinated/decaffeinated beverages or chocolate 12 hours prior to your test. Do not take any antihistamines 12 hours prior to your test.  On the Day of the Test: Drink plenty of water until 1 hour prior to the test. Do not eat any food 1 hour prior to test. You may take your regular medications prior to the test.  Take metoprolol (Lopressor) two hours prior to test. If you take Furosemide/Hydrochlorothiazide/Spironolactone, please HOLD on the morning of the test. FEMALES- please wear underwire-free bra if available, avoid dresses & tight clothing  After the Test: Drink plenty of water. After receiving IV contrast, you may experience a mild flushed feeling. This is normal. On occasion, you may experience a mild rash up to 24 hours after the test. This is not dangerous. If this occurs, you can take Benadryl 25 mg and increase your fluid intake. If you experience trouble breathing, this can be serious. If it is severe call 911 IMMEDIATELY. If it is mild, please call our office. If you take any of these medications: Glipizide/Metformin, Avandament, Glucavance, please do not take 48 hours after completing test unless otherwise instructed.  We will call to schedule your test 2-4 weeks out understanding that some insurance companies will need an authorization prior to the service being performed.   For non-scheduling related questions, please contact the cardiac imaging nurse navigator should you have any questions/concerns: Rockwell Alexandria, Cardiac Imaging Nurse Navigator Larey Brick, Cardiac Imaging Nurse Navigator  Heart  and Vascular Services Direct Office Dial: [phone]   For scheduling needs, including cancellations and rescheduling, please call [place], [phone].   Follow-Up: At [facility], you and your health needs are our priority.  As  part of our continuing mission to provide you with exceptional heart care, we have created designated Provider Care Teams.  These Care Teams include your primary Cardiologist (physician) and Advanced Practice Providers (APPs -  Physician Assistants and Nurse Practitioners) who all work together to provide you with the care you need, when you need it.  Your next appointment:   4 -6 week(s)  Provider:   Parke Poisson, MD

## 2023-03-07 NOTE — Progress Notes (Signed)
Cardiology Office Note:    Date:  03/07/2023   ID:  John Chen, DOB [birth year], MRN [id number]  PCP:  John Sax, MD  Cardiologist:  John Poisson, MD  Electrophysiologist:  None   Referring MD: John Scull, NP   Chief Complaint/Reason for Referral: Chest pain  History of Present Illness:    John Chen is a 52 y.o. male with a history of hypertension, who presents for evaluation of chest discomfort occurring over the last 2 months.  Last episode was 1 to 2 weeks ago, and lasted a few hours before resolving.  Unlike normal it did not get better on its own.  He noticed some discomfort into his neck and back.  Approximately 2 months ago there were no inciting events but he noticed that his chest discomfort was 2-3 times a week lasting seconds at a time, then progressing to minutes at a time and now last episode hours.  He works as an Personnel officer and does not note discomfort at work.  He also exercises at the gym 2-3 times a week and has no symptoms while lifting weights.  He has experienced musculoskeletal pain before given his weightlifting regimen and feels the symptoms are different than musculoskeletal discomfort.  Discomfort can occur substernal and left of substernal with radiation to the neck and back and can happen at work or sitting at home.  He has no associated shortness of breath and does not feel it is related to food or acid reflux.  Former smoker approximately 2 years of smoking half a pack per day also has some moderate alcohol consumption 2-3 drinks per day after work.  Does have a history of hypertension and previously managed with both amlodipine and chlorthalidone.  Chlorthalidone was stopped due to patient feeling overly dry mouth, this symptom has resolved with eliminating diuretic.  Continues on amlodipine and blood pressure is not at goal and is currently 138/86.  Family history is significant for his older brother who had an MI in his 30s but was  using drugs and was overweight.  The patient denies dyspnea at rest or with exertion, palpitations, PND, orthopnea, or leg swelling. Denies cough, fever, chills. Denies nausea, vomiting. Denies syncope or presyncope. Denies dizziness or lightheadedness.   Past Medical History:  Diagnosis Date   Hypertension     Past Surgical History:  Procedure Laterality Date   NO PAST SURGERIES      Current Medications: Current Meds  Medication Sig   [DISCONTINUED] amLODipine (NORVASC) 5 MG tablet Take 1 tablet (5 mg total) by mouth daily.     Allergies:   Amoxicillin   Social History   Tobacco Use   Smoking status: Never   Smokeless tobacco: Never  Vaping Use   Vaping Use: Never used  Substance Use Topics   Alcohol use: Yes    Alcohol/week: 2.0 standard drinks of alcohol    Types: 1 Cans of beer, 1 Glasses of wine per week   Drug use: No     Family History: The patient's family history includes Asthma in his sister and sister; Drug abuse in his brother; Early death in his brother; Heart attack in his brother; Hypertension in his father, maternal grandmother, mother, and sister. There is no history of Alcohol abuse, Cancer, Diabetes, Hearing loss, Heart disease, Hyperlipidemia, Kidney disease, Stroke, Colon cancer, Stomach cancer, Esophageal cancer, or Colon polyps.  ROS:   Please see the history of present illness.    All other systems reviewed and  are negative.  EKGs/Labs/Other Studies Reviewed:    The following studies were reviewed today:  EKG:  sinus bradycardia with low anterior forces  Imaging studies that I have independently reviewed today: N/A  Recent Labs: 02/02/2023: ALT 46; BUN 18; Creatinine, Ser 1.52; Hemoglobin 15.1; Platelets 219.0; Potassium 3.9; Sodium 138; TSH 1.18  Recent Lipid Panel    Component Value Date/Time   CHOL 146 02/02/2023 1647   TRIG 239.0 (H) 02/02/2023 1647   HDL 52.00 02/02/2023 1647   CHOLHDL 3 02/02/2023 1647   VLDL 47.8 (H)  02/02/2023 1647   LDLCALC 94 01/01/2020 1617   LDLDIRECT 65.0 02/02/2023 1647    Physical Exam:    VS:  BP 138/86 (BP Location: Left Arm, Patient Position: Sitting, Cuff Size: Large)   Pulse (!) 56   Ht 5\' 9"  (1.753 m)   Wt 189 lb 12.8 oz (86.1 kg)   SpO2 96%   BMI 28.03 kg/m     Wt Readings from Last 5 Encounters:  03/07/23 189 lb 12.8 oz (86.1 kg)  02/02/23 190 lb 6.4 oz (86.4 kg)  07/30/22 181 lb 9.6 oz (82.4 kg)  07/26/22 198 lb (89.8 kg)  06/10/22 198 lb (89.8 kg)    Constitutional: No acute distress Eyes: sclera non-icteric, normal conjunctiva and lids ENMT: normal dentition, moist mucous membranes Cardiovascular: regular rhythm, normal rate, no murmur. S1 and S2 normal. No jugular venous distention.  Respiratory: clear to auscultation bilaterally GI : normal bowel sounds, soft and nontender. No distention.   MSK: extremities warm, well perfused. No edema.  NEURO: grossly nonfocal exam, moves all extremities. PSYCH: alert and oriented x 3, normal mood and affect.   ASSESSMENT:    1. Essential hypertension   2. Precordial pain   3. Medication management    PLAN:    Essential hypertension - Plan: EKG 12-Lead, amLODipine (NORVASC) 10 MG tablet, Basic metabolic panel  Precordial pain - Plan: CT CORONARY MORPH W/CTA COR W/SCORE W/CA W/CM &/OR WO/CM, Basic metabolic panel  Medication management  -For chest pain we will perform a coronary CTA.  Could have considered an exercise treadmill test, however with low anterior forces, did not want to risk nondiagnostic exercise treadmill test.  Coronary CTA will assess stenosis and calcifications and will be a better prognostic test. -Hypertension suboptimally managed at this time after stopping chlorthalidone, recommend increasing amlodipine to 10 mg daily.   John Brass, MD, [facility]  [facility]   Shared Decision Making/Informed Consent:       Medication Adjustments/Labs and Tests Ordered: Current  medicines are reviewed at length with the patient today.  Concerns regarding medicines are outlined above.   Orders Placed This Encounter  Procedures   CT CORONARY MORPH W/CTA COR W/SCORE W/CA W/CM &/OR WO/CM   Basic metabolic panel   EKG 12-Lead    Meds ordered this encounter  Medications   amLODipine (NORVASC) 10 MG tablet    Sig: Take 1 tablet (10 mg total) by mouth daily.    Dispense:  90 tablet    Refill:  3    Patient Instructions  Medication Instructions:  INCREASE AMLODIPINE TO 10mg  DAILY  *If you need a refill on your cardiac medications before your next appointment, please call your pharmacy*  Lab Work: Please return for Blood Work ONE WEEK PRIOR TO CCTA SCAN. No appointment needed, lab here at the office is open Monday-Friday from 8AM to 4PM and closed daily for lunch from 12:45-1:45.   If you have  labs (blood work) drawn today and your tests are completely normal, you will receive your results only by: MyChart Message (if you have MyChart) OR A paper copy in the mail If you have any lab test that is abnormal or we need to change your treatment, we will call you to review the results.  Testing/Procedures: Your physician has requested that you have cardiac CT. Cardiac computed tomography (CT) is a painless test that uses an x-ray machine to take clear, detailed pictures of your heart. For further information please visit [email or web address] Please follow instruction sheet as given.    Your cardiac CT will be scheduled at one of the below locations:   [facility] [address] [place], [place] [address] [phone]  If scheduled at [facility], please arrive at the [facility] and Children's Entrance (Entrance C2) of [facility] 30 minutes prior to test start time. You can use the FREE valet parking offered at entrance C (encouraged to control the heart rate for the test)  Proceed to the [facility] Radiology Department (first floor) to  check-in and test prep.  All radiology patients and guests should use entrance C2 at [facility], accessed from [facility], even though the hospital's physical address listed is [address]    Please follow these instructions carefully (unless otherwise directed):  Hold all erectile dysfunction medications at least 3 days (72 hrs) prior to test. (Ie viagra, cialis, sildenafil, tadalafil, etc) We will administer nitroglycerin during this exam.   On the Night Before the Test: Be sure to Drink plenty of water. Do not consume any caffeinated/decaffeinated beverages or chocolate 12 hours prior to your test. Do not take any antihistamines 12 hours prior to your test.  On the Day of the Test: Drink plenty of water until 1 hour prior to the test. Do not eat any food 1 hour prior to test. You may take your regular medications prior to the test.  Take metoprolol (Lopressor) two hours prior to test. If you take Furosemide/Hydrochlorothiazide/Spironolactone, please HOLD on the morning of the test. FEMALES- please wear underwire-free bra if available, avoid dresses & tight clothing  After the Test: Drink plenty of water. After receiving IV contrast, you may experience a mild flushed feeling. This is normal. On occasion, you may experience a mild rash up to 24 hours after the test. This is not dangerous. If this occurs, you can take Benadryl 25 mg and increase your fluid intake. If you experience trouble breathing, this can be serious. If it is severe call 911 IMMEDIATELY. If it is mild, please call our office. If you take any of these medications: Glipizide/Metformin, Avandament, Glucavance, please do not take 48 hours after completing test unless otherwise instructed.  We will call to schedule your test 2-4 weeks out understanding that some insurance companies will need an authorization prior to the service being performed.   For non-scheduling related questions,  please contact the cardiac imaging nurse navigator should you have any questions/concerns: Rockwell Alexandria, Cardiac Imaging Nurse Navigator Larey Brick, Cardiac Imaging Nurse Navigator [place] [facility] Direct Office Dial: [phone]   For scheduling needs, including cancellations and rescheduling, please call [place], [phone].   Follow-Up: At [facility], you and your health needs are our priority.  As part of our continuing mission to provide you with exceptional heart care, we have created designated Provider Care Teams.  These Care Teams include your primary Cardiologist (physician) and  Advanced Practice Providers (APPs -  Physician Assistants and Nurse Practitioners) who all work together to provide you with the care you need, when you need it.  Your next appointment:   4 -6 week(s)  Provider:   Parke Poisson, MD

## 2023-03-12 LAB — BASIC METABOLIC PANEL
BUN/Creatinine Ratio: 13 (ref 9–20)
BUN: 16 mg/dL (ref 6–24)
CO2: 22 mmol/L (ref 20–29)
Calcium: 9.8 mg/dL (ref 8.7–10.2)
Chloride: 101 mmol/L (ref 96–106)
Creatinine, Ser: 1.2 mg/dL (ref 0.76–1.27)
Glucose: 156 mg/dL — ABNORMAL HIGH (ref 70–99)
Potassium: 4.3 mmol/L (ref 3.5–5.2)
Sodium: 142 mmol/L (ref 134–144)
eGFR: 72 mL/min/{1.73_m2} (ref >59)

## 2023-03-17 ENCOUNTER — Telehealth (HOSPITAL_COMMUNITY): Payer: Self-pay | Admitting: *Deleted

## 2023-03-17 NOTE — Telephone Encounter (Signed)
Reaching out to patient to offer assistance regarding upcoming cardiac imaging study; pt verbalizes understanding of appt date/time, parking situation and where to check in, pre-test NPO status, and verified current allergies; name and call back number provided for further questions should they arise  Endi Lagman RN Navigator Cardiac Imaging [place] [facility] [phone] office [phone] cell  Patient aware to arrive at 3pm. 

## 2023-03-18 ENCOUNTER — Ambulatory Visit (HOSPITAL_COMMUNITY)
Admission: RE | Admit: 2023-03-18 | Discharge: 2023-03-18 | Disposition: A | Discharge status: Home / Self Care | Payer: PRIVATE HEALTH INSURANCE | Source: Ambulatory Visit | Attending: Internal Medicine | Admitting: Internal Medicine

## 2023-03-18 DIAGNOSIS — R072 Precordial pain: Secondary | ICD-10-CM | POA: Insufficient documentation

## 2023-03-18 MED ORDER — NITROGLYCERIN 0.4 MG SL SUBL
SUBLINGUAL_TABLET | SUBLINGUAL | Status: AC
  Filled 2023-03-18: qty 2

## 2023-03-18 MED ORDER — NITROGLYCERIN 0.4 MG SL SUBL
0.8000 mg | SUBLINGUAL_TABLET | Freq: Once | SUBLINGUAL | Status: AC
  Administered 2023-03-18: 0.8 mg via SUBLINGUAL

## 2023-03-18 MED ORDER — IOHEXOL 350 MG/ML SOLN
95.0000 mL | Freq: Once | INTRAVENOUS | Status: AC | PRN
  Administered 2023-03-18: 95 mL via INTRAVENOUS

## 2023-03-28 ENCOUNTER — Encounter: Payer: Self-pay | Admitting: *Deleted

## 2023-04-01 ENCOUNTER — Encounter: Payer: PRIVATE HEALTH INSURANCE | Admitting: Nurse Practitioner

## 2023-04-01 ENCOUNTER — Telehealth: Payer: Self-pay | Admitting: Family Medicine

## 2023-04-01 NOTE — Telephone Encounter (Signed)
6.7.24 no show letter sent

## 2023-04-04 ENCOUNTER — Encounter: Payer: Self-pay | Admitting: Nurse Practitioner

## 2023-04-04 ENCOUNTER — Ambulatory Visit: Payer: PRIVATE HEALTH INSURANCE | Attending: Internal Medicine | Admitting: Internal Medicine

## 2023-04-04 NOTE — Telephone Encounter (Signed)
2nd missed TOC appt with Lauren. Unable to reschedule as TOC/NP with Lauren and letter sent to pt via mychart to notify. Pt can schedule with Dr. Doreene Burke for future appts since already established with him.

## 2023-04-04 NOTE — Telephone Encounter (Signed)
Noted  

## 2023-06-24 ENCOUNTER — Ambulatory Visit: Payer: PRIVATE HEALTH INSURANCE | Admitting: Family Medicine

## 2023-06-24 ENCOUNTER — Telehealth: Payer: Self-pay | Admitting: Family Medicine

## 2023-06-24 NOTE — Telephone Encounter (Signed)
NS late leaving work. Letter printed

## 2023-06-24 NOTE — Telephone Encounter (Signed)
Pt confirmed appt 9/3 3:20p

## 2023-06-28 ENCOUNTER — Ambulatory Visit: Payer: PRIVATE HEALTH INSURANCE | Admitting: Family Medicine

## 2023-06-28 ENCOUNTER — Encounter: Payer: Self-pay | Admitting: Family Medicine

## 2023-06-28 ENCOUNTER — Ambulatory Visit (INDEPENDENT_AMBULATORY_CARE_PROVIDER_SITE_OTHER)
Admission: RE | Admit: 2023-06-28 | Discharge: 2023-06-28 | Disposition: A | Discharge status: Home / Self Care | Payer: PRIVATE HEALTH INSURANCE | Source: Ambulatory Visit | Attending: Family Medicine | Admitting: Family Medicine

## 2023-06-28 VITALS — BP 130/82 | HR 62 | Temp 97.8°F | Ht 69.0 in | Wt 193.6 lb

## 2023-06-28 DIAGNOSIS — I10 Essential (primary) hypertension: Secondary | ICD-10-CM | POA: Diagnosis not present

## 2023-06-28 DIAGNOSIS — J029 Acute pharyngitis, unspecified: Secondary | ICD-10-CM | POA: Diagnosis not present

## 2023-06-28 LAB — POCT RAPID STREP A (OFFICE): Rapid Strep A Screen: NEGATIVE

## 2023-06-28 MED ORDER — AMLODIPINE BESYLATE 10 MG PO TABS: 10.0000 mg | ORAL_TABLET | Freq: Every day | ORAL | 3 refills | Status: DC

## 2023-06-28 MED ORDER — AZITHROMYCIN 250 MG PO TABS: ORAL_TABLET | ORAL | 0 refills | Status: AC

## 2023-06-28 NOTE — Progress Notes (Signed)
Established Patient Office Visit   Subjective:  Patient ID: John Chen, male    DOB: 03/30/1971  Age: 52 y.o. MRN: [id number]  Chief Complaint  Patient presents with   Sore Throat    Sore thorat and burning x 1 month. Pt complains of burning in chest as well with headache a fatigue.     Sore Throat  Associated symptoms include coughing. Pertinent negatives include no abdominal pain, congestion, shortness of breath or vomiting.   Encounter Diagnoses  Name Primary?   Sore throat Yes   Essential hypertension    Presents with a 1 month history of sore throat in the back of his mouth.  He denies headaches, fever or chills, postnasal drip, rhinorrhea, reflux, myalgias, nausea.  There is been mild burning in his chest.  What little cough he is experienced has been dry.  There is been no wheezing or difficulty breathing.  No asthma history.  Recently separated and is under expected stress.  No one is ever complained about snoring.  Has not been sleeping as well after the separation.  Mouth and nose are dry.  No history of fall allergies.   Review of Systems  Constitutional: Negative.  Negative for chills and fever.  HENT:  Positive for sore throat. Negative for congestion and sinus pain.   Eyes:  Negative for blurred vision, discharge and redness.  Respiratory:  Positive for cough. Negative for hemoptysis, sputum production, shortness of breath and wheezing.   Cardiovascular: Negative.   Gastrointestinal:  Negative for abdominal pain, heartburn, nausea and vomiting.  Genitourinary: Negative.   Musculoskeletal: Negative.  Negative for joint pain and myalgias.  Skin:  Negative for rash.  Neurological:  Negative for tingling, loss of consciousness and weakness.  Endo/Heme/Allergies:  Negative for polydipsia.     Current Outpatient Medications:    azithromycin (ZITHROMAX) 250 MG tablet, Take 2 tablets on day 1, then 1 tablet daily on days 2 through 5, Disp: 6 tablet, Rfl: 0    amLODipine (NORVASC) 10 MG tablet, Take 1 tablet (10 mg total) by mouth daily., Disp: 90 tablet, Rfl: 3   Objective:     BP 130/82   Pulse 62   Temp 97.8 F (36.6 C)   Ht 5\' 9"  (1.753 m)   Wt 193 lb 9.6 oz (87.8 kg)   SpO2 96%   BMI 28.59 kg/m    Physical Exam Constitutional:      General: He is not in acute distress.    Appearance: Normal appearance. He is not ill-appearing, toxic-appearing or diaphoretic.  HENT:     Head: Normocephalic and atraumatic.     Right Ear: Tympanic membrane, ear canal and external ear normal.     Left Ear: Tympanic membrane, ear canal and external ear normal.     Mouth/Throat:     Mouth: Mucous membranes are moist.     Pharynx: Oropharynx is clear. No oropharyngeal exudate or posterior oropharyngeal erythema.     Tonsils: No tonsillar exudate or tonsillar abscesses.   Eyes:     General: No scleral icterus.       Right eye: No discharge.        Left eye: No discharge.     Extraocular Movements: Extraocular movements intact.     Conjunctiva/sclera: Conjunctivae normal.     Pupils: Pupils are equal, round, and reactive to light.  Neck:     Thyroid: No thyromegaly.  Cardiovascular:     Rate and Rhythm: Normal rate and regular rhythm.  Pulmonary:     Effort: Pulmonary effort is normal. No respiratory distress.     Breath sounds: Normal breath sounds. No wheezing, rhonchi or rales.  Abdominal:     General: Bowel sounds are normal.     Tenderness: There is no abdominal tenderness. There is no guarding.  Musculoskeletal:     Cervical back: No rigidity or tenderness.  Lymphadenopathy:     Cervical: No cervical adenopathy.  Skin:    General: Skin is warm and dry.  Neurological:     Mental Status: He is alert and oriented to person, place, and time.  Psychiatric:        Mood and Affect: Mood normal.        Behavior: Behavior normal.      Results for orders placed or performed in visit on 06/28/23  POCT rapid strep A  Result Value Ref  Range   Rapid Strep A Screen Negative Negative      The 10-year ASCVD risk score (Arnett DK, et al., 2019) is: 9.3%    Assessment & Plan:   Sore throat -     POCT rapid strep A -     Azithromycin; Take 2 tablets on day 1, then 1 tablet daily on days 2 through 5  Dispense: 6 tablet; Refill: 0 -     Ambulatory referral to ENT -     DG Chest 2 View; Future  Essential hypertension -     amLODIPine Besylate; Take 1 tablet (10 mg total) by mouth daily.  Dispense: 90 tablet; Refill: 3    Return in about 3 months (around 09/27/2023), or if symptoms worsen or fail to improve.  Sore throat without a clear etiology.  Zithromax to cover atypicals.  ENT referral. Apnea?  Mliss Sax, MD

## 2023-08-12 ENCOUNTER — Ambulatory Visit (INDEPENDENT_AMBULATORY_CARE_PROVIDER_SITE_OTHER): Payer: PRIVATE HEALTH INSURANCE | Admitting: Otolaryngology

## 2023-08-12 ENCOUNTER — Encounter (INDEPENDENT_AMBULATORY_CARE_PROVIDER_SITE_OTHER): Payer: Self-pay | Admitting: Otolaryngology

## 2023-08-12 VITALS — BP 145/90 | HR 55 | Ht 68.5 in | Wt 195.2 lb

## 2023-08-12 DIAGNOSIS — R070 Pain in throat: Secondary | ICD-10-CM

## 2023-08-12 DIAGNOSIS — R09A2 Foreign body sensation, throat: Secondary | ICD-10-CM

## 2023-08-12 DIAGNOSIS — K219 Gastro-esophageal reflux disease without esophagitis: Secondary | ICD-10-CM

## 2023-08-12 DIAGNOSIS — R1310 Dysphagia, unspecified: Secondary | ICD-10-CM

## 2023-08-12 DIAGNOSIS — H6993 Unspecified Eustachian tube disorder, bilateral: Secondary | ICD-10-CM

## 2023-08-12 DIAGNOSIS — R058 Other specified cough: Secondary | ICD-10-CM

## 2023-08-12 DIAGNOSIS — H9313 Tinnitus, bilateral: Secondary | ICD-10-CM

## 2023-08-12 MED ORDER — CETIRIZINE HCL 10 MG PO TABS: 10.0000 mg | ORAL_TABLET | Freq: Every day | ORAL | 11 refills | Status: DC

## 2023-08-12 MED ORDER — FLUTICASONE PROPIONATE 50 MCG/ACT NA SUSP: 2.0000 | Freq: Two times a day (BID) | NASAL | 6 refills | Status: DC

## 2023-08-12 MED ORDER — FAMOTIDINE 20 MG PO TABS: 20.0000 mg | ORAL_TABLET | Freq: Two times a day (BID) | ORAL | 1 refills | Status: DC

## 2023-08-12 NOTE — Progress Notes (Unsigned)
ENT CONSULT:  Reason for Consult: sore throat/globus/burning sensation x 2 months and ringing in his ears  HPI: John Chen is an 52 y.o. male with hx cervical radiculopathy, reactive airway disease, here for evaluation of throat discomfort globus sensation and frequent burning in his throat for 2 months as well as ringing in his ears. He reports sore throat/raw sensation in his throat and it gets better intermittently, but gets worse again.  Symptoms present for x 2 months. He had reflux medications in the past. He eats late dinner. He had heartburn years ago, and his sx are different this time. He also has dry cough and lump sensation in his throat. He is on Nexium and it is helping. He tried to eat earlier x last 2 months, and symptoms are better no. If he eats foods that trigger reflux, his throat sx get worse.  He struggles to swallow food primarily solid foods when his symptoms are severe. He reports bilateral ringing in his ears for years. R ear feels clogged and he has R > L tinnitus has had it for years.  No ear pain, no ear drainage, no prior ear surgeries, no prior history of recurrent ear infections.  No recent hearing evaluation.  Records Reviewed:  PCP office note by Dr Nadene Rubins,   Sore thorat and burning x 1 month. Pt complains of burning in chest as well with headache a fatigue.    Presents with a 1 month history of sore throat in the back of his mouth. He denies headaches, fever or chills, postnasal drip, rhinorrhea, reflux, myalgias, nausea. There is been mild burning in his chest. What little cough he is experienced has been dry. There is been no wheezing or difficulty breathing. No asthma history. Recently separated and is under expected stress. No one is ever complained about snoring. Has not been sleeping as well after the separation. Mouth and nose are dry. No history of fall allergies.   Sore throat -     POCT rapid strep A -     Azithromycin; Take 2 tablets on day  1, then 1 tablet daily on days 2 through 5  Dispense: 6 tablet; Refill: 0 -     Ambulatory referral to ENT -     DG Chest 2 View; Future   Essential hypertension -     amLODIPine Besylate; Take 1 tablet (10 mg total) by mouth daily.  Dispense: 90 tablet; Refill: 3   Past Medical History:  Diagnosis Date   Hypertension     Past Surgical History:  Procedure Laterality Date   NO PAST SURGERIES      Family History  Problem Relation Age of Onset   Hypertension Mother    Hypertension Father    Asthma Sister    Hypertension Sister    Asthma Sister    Drug abuse Brother    Early death Brother    Heart attack Brother    Hypertension Maternal Grandmother    Alcohol abuse Neg Hx    Cancer Neg Hx    Diabetes Neg Hx    Hearing loss Neg Hx    Heart disease Neg Hx    Hyperlipidemia Neg Hx    Kidney disease Neg Hx    Stroke Neg Hx    Colon cancer Neg Hx    Stomach cancer Neg Hx    Esophageal cancer Neg Hx    Colon polyps Neg Hx     Social History:  reports that he has never  smoked. He has never used smokeless tobacco. He reports current alcohol use of about 2.0 standard drinks of alcohol per week. He reports that he does not use drugs.  Allergies:  Allergies  Allergen Reactions   Amoxicillin Rash    Medications: I have reviewed the patient's current medications.  The PMH, PSH, Medications, Allergies, and SH were reviewed and updated.  ROS: Constitutional: Negative for fever, weight loss and weight gain. Cardiovascular: Negative for chest pain and dyspnea on exertion. Respiratory: Is not experiencing shortness of breath at rest. Gastrointestinal: Negative for nausea and vomiting. Neurological: Negative for headaches. Psychiatric: The patient is not nervous/anxious  Blood pressure (!) 145/90, pulse (!) 55, height 5' 8.5" (1.74 m), weight 195 lb 3.2 oz (88.5 kg), SpO2 98%.  PHYSICAL EXAM:  Exam: General: Well-developed, well-nourished Respiratory Respiratory effort:  Equal inspiration and expiration without stridor Cardiovascular Peripheral Vascular: Warm extremities with equal color/perfusion Eyes: No nystagmus with equal extraocular motion bilaterally Neuro/Psych/Balance: Patient oriented to person, place, and time; Appropriate mood and affect; Gait is intact with no imbalance; Cranial nerves I-XII are intact Head and Face Inspection: Normocephalic and atraumatic without mass or lesion Palpation: Facial skeleton intact without bony stepoffs Salivary Glands: No mass or tenderness Facial Strength: Facial motility symmetric and full bilaterally ENT Pinna: External ear intact and fully developed External canal: Canal is patent with intact skin Tympanic Membrane: Clear and mobile External Nose: No scar or anatomic deformity Internal Nose: Septum is deviated to the left. No polyp, or purulence. Mucosal edema and erythema present.  Bilateral inferior turbinate hypertrophy.  Lips, Teeth, and gums: Mucosa and teeth intact and viable TMJ: No pain to palpation with full mobility Oral cavity/oropharynx: No erythema or exudate, no lesions present Nasopharynx: No mass or lesion with intact mucosa Hypopharynx: Intact mucosa without pooling of secretions Larynx Glottic: Full true vocal cord mobility without lesion or mass Supraglottic: Normal appearing epiglottis and AE folds Interarytenoid Space: Moderate pachydermia edema Subglottic Space: Patent without lesion or edema Neck Neck and Trachea: Midline trachea without mass or lesion Thyroid: No mass or nodularity Lymphatics: No lymphadenopathy  Procedure: Preoperative diagnosis: Chronic throat discomfort, globus sensation, dry cough  Postoperative diagnosis:   Same + GERD LPR  Procedure: Flexible fiberoptic laryngoscopy  Surgeon: Ashok Croon, MD  Anesthesia: Topical lidocaine and Afrin Complications: None Condition is stable throughout exam  Indications and consent:  The patient presents to the  clinic with Indirect laryngoscopy view was incomplete. Thus it was recommended that they undergo a flexible fiberoptic laryngoscopy. All of the risks, benefits, and potential complications were reviewed with the patient preoperatively and verbal informed consent was obtained.  Procedure: The patient was seated upright in the clinic. Topical lidocaine and Afrin were applied to the nasal cavity. After adequate anesthesia had occurred, I then proceeded to pass the flexible telescope into the nasal cavity. The nasal cavity was patent without rhinorrhea or polyp. The nasopharynx was also patent without mass or lesion. The base of tongue was visualized and was normal. There were no signs of pooling of secretions in the piriform sinuses. The true vocal folds were mobile bilaterally. There were no signs of glottic or supraglottic mucosal lesion or mass. There was moderate interarytenoid pachydermia and post cricoid edema. The telescope was then slowly withdrawn and the patient tolerated the procedure throughout.   Assessment/Plan: Encounter Diagnoses  Name Primary?   Dysphagia, unspecified type    Tinnitus of both ears    Gastroesophageal reflux disease without esophagitis  Throat discomfort Yes   Globus sensation    Dysfunction of both eustachian tubes    Chronic throat discomfort globus sensation dry cough and intermittent dysphagia.  Flexible laryngoscopy with evidence of GERD LPR otherwise no concerning findings.  -Suspect untreated GERD LPR is the primary etiology for his symptoms -Screening esophagram to rule out esophageal pathology and risk factors for severe reflux -Trial of Pepcid 20 mg twice daily plus reflux Gourmet -Diet and lifestyle changes to minimize reflux -He is concerned about symptoms and would like to rule out esophageal cancer-due to severe symptoms of heartburn and GERD LPR, will refer to GI for upper endoscopy to rule out Barrett's  2.  Intermittent dry cough in the setting  of history of reactive airway disease, evidence of nasal congestion and postnasal drainage on scope exam today.  Also reports sensation of clogged ears or ear fullness.  Suspect element of seasonal versus environmental allergies and eustachian tube dysfunction. - start Flonase 2 puffs bilateral nares twice daily and Zyrtec 10 mg daily  3.  Bilateral tinnitus for years-DDx includes eustachian tube dysfunction versus conductive versus sensorineural hearing loss - schedule Audiogram  Return after testing  Thank you for allowing me to participate in the care of this patient. Please do not hesitate to contact me with any questions or concerns.   Ashok Croon, MD Otolaryngology [facility] Phone: [phone] Fax: [phone]    08/14/2023, 2:51 PM

## 2023-08-12 NOTE — Patient Instructions (Addendum)
-   start Flonase and Zyrtec - start Pepcid and reflux gourmet - schedule Audiogram - schedule Esophagram - return after testing  - schedule GI consult EGD consult   See information about Eustachian Tube Dysfunction below:    Overview The eustachian (say "you-STAY-shee-un") tubes connect the middle ear on each side to the back of the throat. They keep air pressure stable in the ears. If your eustachian tubes become blocked, the air pressure in your ears changes. A quick change in air pressure can cause eustachian tubes to close up. This might happen when an airplane changes altitude or when a scuba diver goes up or down underwater. And a cold can make the tubes swell and block the fluid in the middle ear from draining out. That can cause pain.  Eustachian tube problems often clear up on their own or after treating the cause of the blockage. If your tubes continue to be blocked, you may need surgery.  Follow-up care is a key part of your treatment and safety. Be sure to make and go to all appointments, and call your doctor or nurse advice line (811 in most provinces and territories) if you are having problems. It's also a good idea to know your test results and keep a list of the medicines you take.  How can you care for yourself at home? Try a simple exercise to help open blocked tubes. Close your mouth, hold your nose, and gently blow as if you are blowing your nose. Yawning and chewing gum also may help. You may hear or feel a "pop" when the tubes open. To ease ear pain, apply a warm face cloth or a heating pad set on low. There may be some drainage from the ear when the heat melts earwax. Put a cloth between the heat source and your skin. If your doctor prescribed antibiotics, take them as directed. Do not stop taking them just because you feel better. You need to take the full course of antibiotics. Be safe with medicines. Depending on the cause of the problem, your doctor may recommend  over-the-counter medicine. For example, adults may try decongestants for cold symptoms or nasal spray steroids for allergies. Follow the instructions carefully. - Take Reflux Gourmet (natural supplement available on [employer]) to help with symptoms of chronic throat irritation

## 2023-08-18 ENCOUNTER — Institutional Professional Consult (permissible substitution) (INDEPENDENT_AMBULATORY_CARE_PROVIDER_SITE_OTHER): Payer: PRIVATE HEALTH INSURANCE | Admitting: Otolaryngology

## 2023-08-19 ENCOUNTER — Encounter (HOSPITAL_COMMUNITY): Payer: Self-pay

## 2023-08-19 ENCOUNTER — Emergency Department (HOSPITAL_COMMUNITY): Payer: PRIVATE HEALTH INSURANCE

## 2023-08-19 ENCOUNTER — Other Ambulatory Visit: Payer: Self-pay

## 2023-08-19 ENCOUNTER — Emergency Department (HOSPITAL_COMMUNITY)
Admission: EM | Admit: 2023-08-19 | Discharge: 2023-08-19 | Disposition: A | Discharge status: Home / Self Care | Payer: PRIVATE HEALTH INSURANCE

## 2023-08-19 DIAGNOSIS — R109 Unspecified abdominal pain: Secondary | ICD-10-CM | POA: Diagnosis present

## 2023-08-19 DIAGNOSIS — K429 Umbilical hernia without obstruction or gangrene: Secondary | ICD-10-CM | POA: Insufficient documentation

## 2023-08-19 DIAGNOSIS — Z79899 Other long term (current) drug therapy: Secondary | ICD-10-CM | POA: Diagnosis not present

## 2023-08-19 DIAGNOSIS — I10 Essential (primary) hypertension: Secondary | ICD-10-CM | POA: Insufficient documentation

## 2023-08-19 LAB — CBC
HCT: 45.3 % (ref 39.0–52.0)
Hemoglobin: 15 g/dL (ref 13.0–17.0)
MCH: 30.4 pg (ref 26.0–34.0)
MCHC: 33.1 g/dL (ref 30.0–36.0)
MCV: 91.7 fL (ref 80.0–100.0)
Platelets: 215 10*3/uL (ref 150–400)
RBC: 4.94 MIL/uL (ref 4.22–5.81)
RDW: 11.3 % — ABNORMAL LOW (ref 11.5–15.5)
WBC: 5.6 10*3/uL (ref 4.0–10.5)
nRBC: 0 % (ref 0.0–0.2)

## 2023-08-19 LAB — COMPREHENSIVE METABOLIC PANEL
ALT: 41 U/L (ref 0–44)
AST: 35 U/L (ref 15–41)
Albumin: 4.3 g/dL (ref 3.5–5.0)
Alkaline Phosphatase: 45 U/L (ref 38–126)
Anion gap: 9 (ref 5–15)
BUN: 19 mg/dL (ref 6–20)
CO2: 26 mmol/L (ref 22–32)
Calcium: 9.5 mg/dL (ref 8.9–10.3)
Chloride: 101 mmol/L (ref 98–111)
Creatinine, Ser: 1.23 mg/dL (ref 0.61–1.24)
GFR, Estimated: >60 mL/min (ref >60)
Glucose, Bld: 176 mg/dL — ABNORMAL HIGH (ref 70–99)
Potassium: 4 mmol/L (ref 3.5–5.1)
Sodium: 136 mmol/L (ref 135–145)
Total Bilirubin: 0.9 mg/dL (ref 0.3–1.2)
Total Protein: 7.7 g/dL (ref 6.5–8.1)

## 2023-08-19 LAB — URINALYSIS, ROUTINE W REFLEX MICROSCOPIC
Bilirubin Urine: NEGATIVE
Glucose, UA: NEGATIVE mg/dL
Hgb urine dipstick: NEGATIVE
Ketones, ur: NEGATIVE mg/dL
Leukocytes,Ua: NEGATIVE
Nitrite: NEGATIVE
Protein, ur: NEGATIVE mg/dL
Specific Gravity, Urine: 1.019 (ref 1.005–1.030)
pH: 5 (ref 5.0–8.0)

## 2023-08-19 LAB — LIPASE, BLOOD: Lipase: 32 U/L (ref 11–51)

## 2023-08-19 MED ORDER — IOHEXOL 300 MG/ML  SOLN
100.0000 mL | Freq: Once | INTRAMUSCULAR | Status: AC | PRN
  Administered 2023-08-19: 100 mL via INTRAVENOUS

## 2023-08-19 NOTE — ED Triage Notes (Signed)
Patient states pain in umbilicus. Pt endorses that it sticks out and is bigger than normal. Patient states feels like a needle is stabbing him in his stomach.

## 2023-08-19 NOTE — ED Provider Notes (Signed)
[facility] AT [facility] Provider Note   CSN: [id number] Arrival date & time: 08/19/23  1349     History  Chief Complaint  Patient presents with   Abdominal Pain    John Chen is a 52 y.o. male.  Patient with history of hypertension presents today with complaints of abdominal pain.  He states that same has been ongoing for the past 3 days.  He has been told his whole life that he has a umbilical hernia, however it has always been soft and not painful.  He notes in the last 3 days the area has appeared larger and has been painful.  He notes that it feels harder than normal and has been red as well.  Denies any nausea or vomiting.  He is having regular bowel movements.  Denies fevers or chills.  Denies any history of abdominal surgeries.  The history is provided by the patient. No language interpreter was used.  Abdominal Pain      Home Medications Prior to Admission medications   Medication Sig Start Date End Date Taking? Authorizing Provider  amLODipine (NORVASC) 10 MG tablet Take 1 tablet (10 mg total) by mouth daily. 06/28/23   Mliss Sax, MD  cetirizine (ZYRTEC) 10 MG tablet Take 1 tablet (10 mg total) by mouth daily. 08/12/23   Ashok Croon, MD  famotidine (PEPCID) 20 MG tablet Take 1 tablet (20 mg total) by mouth 2 (two) times daily. 08/12/23   Ashok Croon, MD  fluticasone (FLONASE) 50 MCG/ACT nasal spray Place 2 sprays into both nostrils in the morning and at bedtime. 08/12/23   Ashok Croon, MD      Allergies    Amoxicillin    Review of Systems   Review of Systems  Gastrointestinal:  Positive for abdominal pain.  All other systems reviewed and are negative.   Physical Exam Updated Vital Signs BP 131/82   Pulse (!) 59   Temp 98.7 F (37.1 C)   Resp 18   SpO2 99%  Physical Exam Vitals and nursing note reviewed.  Constitutional:      General: He is not in acute distress.    Appearance: Normal  appearance. He is normal weight. He is not ill-appearing, toxic-appearing or diaphoretic.  HENT:     Head: Normocephalic and atraumatic.  Cardiovascular:     Rate and Rhythm: Normal rate.  Pulmonary:     Effort: Pulmonary effort is normal. No respiratory distress.  Abdominal:     General: Abdomen is flat.     Palpations: Abdomen is soft.     Hernia: A hernia is present. Hernia is present in the umbilical area.     Comments: Small periumbilical hernia that is tender to palpation. Able to reduce with pain, however the area immediately protrudes again after reduction. Mild erythema around the area.  Musculoskeletal:        General: Normal range of motion.     Cervical back: Normal range of motion.  Skin:    General: Skin is warm and dry.  Neurological:     General: No focal deficit present.     Mental Status: He is alert.  Psychiatric:        Mood and Affect: Mood normal.        Behavior: Behavior normal.     ED Results / Procedures / Treatments   Labs (all labs ordered are listed, but only abnormal results are displayed) Labs Reviewed  COMPREHENSIVE METABOLIC PANEL - Abnormal; Notable  for the following components:      Result Value   Glucose, Bld 176 (*)    All other components within normal limits  CBC - Abnormal; Notable for the following components:   RDW 11.3 (*)    All other components within normal limits  LIPASE, BLOOD  URINALYSIS, ROUTINE W REFLEX MICROSCOPIC    EKG None  Radiology No results found.  Procedures Procedures    Medications Ordered in ED Medications  iohexol (OMNIPAQUE) 300 MG/ML solution 100 mL (100 mLs Intravenous Contrast Given 08/19/23 1618)    ED Course/ Medical Decision Making/ A&P                                 Medical Decision Making Amount and/or Complexity of Data Reviewed Labs: ordered. Radiology: ordered.  Risk Prescription drug management.   This patient is a 52 y.o. male who presents to the ED for concern of  abdominal pain at umbilical hernia site, this involves an extensive number of treatment options, and is a complaint that carries with it a high risk of complications and morbidity. The emergent differential diagnosis prior to evaluation includes, but is not limited to,  incarcerated/strangulated hernia, obstruction . This is not an exhaustive differential.   Past Medical History / Co-morbidities / Social History:  has a past medical history of Hypertension.  Additional history: Chart reviewed. Pertinent results include: had a colonoscopy in 2016 which was normal  Physical Exam: Physical exam performed. The pertinent findings include: small tender mildly erythematous periumbilical hernia. Able to reduce but protrudes again immediately following reduction  Lab Tests: I ordered, and personally interpreted labs.  The pertinent results include:  glucose 176, no other acute laboratory abnormalities   Imaging Studies: I ordered imaging studies including Ct abdomen pelvis. I independently visualized and interpreted imaging which showed   1. Broad-based, fat containing umbilical hernia unchanged in size compared to prior examination with new associated fat stranding. No evidence of bowel involvement. 2. Occasional pancolonic diverticula without evidence of acute diverticulitis.  I agree with the radiologist interpretation.  Medications: Offered pain medication which patient declined   Disposition: After consideration of the diagnostic results and the patients response to treatment, I feel that emergency department workup does not suggest an emergent condition requiring admission or immediate intervention beyond what has been performed at this time. The plan is: discharge with surgery follow-up and return precautions. Patient with pain from fat containing periumbilical hernia. Will give referral to general surgery for potential operative management. Recommend ice and compression over the area with  tylenol/motrin as needed for pain.  Evaluation and diagnostic testing in the emergency department does not suggest an emergent condition requiring admission or immediate intervention beyond what has been performed at this time.  Plan for discharge with close PCP follow-up.  Patient is understanding and amenable with plan, educated on red flag symptoms that would prompt immediate return.  Patient discharged in stable condition.  This is a shared visit with supervising physician Dr. Maple Hudson who has independently evaluated patient & provided guidance in evaluation/management/disposition, in agreement with care   Final Clinical Impression(s) / ED Diagnoses Final diagnoses:  Umbilical hernia without obstruction and without gangrene    Rx / DC Orders ED Discharge Orders     None     An After Visit Summary was printed and given to the patient.     Vear Clock 08/19/23 1910  Coral Spikes, DO 08/19/23 2306

## 2023-08-19 NOTE — Discharge Instructions (Addendum)
As we discussed, your workup in the ER today was reassuring for acute findings.  CT imaging of your stomach did show that you have a hernia that is likely the source of your pain.  I have given you a referral to general surgery with a number to call to schedule follow-up appointment to discuss operative management of this hernia.  Please call at your earliest convenience.  In the interim, I recommend that you ice the area and take Tylenol/Motrin for pain.   Return if development of any new or worsening symptoms.

## 2023-08-22 ENCOUNTER — Telehealth: Payer: Self-pay

## 2023-08-22 NOTE — Transitions of Care (Post Inpatient/ED Visit) (Signed)
   08/22/2023  Name: John Chen MRN: [id number] DOB: Feb 25, 1971  Today's TOC FU Call Status: Today's TOC FU Call Status:: Successful TOC FU Call Completed TOC FU Call Complete Date: 08/22/23 Patient's Name and Date of Birth confirmed.  Transition Care Management Follow-up Telephone Call Date of Discharge: 08/19/23 Discharge Facility: [facility]) Type of Discharge: Emergency Department How have you been since you were released from the hospital?: Same Any questions or concerns?: No  Items Reviewed: Did you receive and understand the discharge instructions provided?: Yes Any new allergies since your discharge?: No Dietary orders reviewed?: No Do you have support at home?: Yes  Medications Reviewed Today: Medications Reviewed Today   Medications were not reviewed in this encounter     Home Care and Equipment/Supplies: Were Home Health Services Ordered?: NA Any new equipment or medical supplies ordered?: NA  Functional Questionnaire: Do you need assistance with bathing/showering or dressing?: No Do you need assistance with meal preparation?: No Do you need assistance with eating?: No Do you have difficulty maintaining continence: No Do you need assistance with getting out of bed/getting out of a chair/moving?: No Do you have difficulty managing or taking your medications?: No  Follow up appointments reviewed: PCP Follow-up appointment confirmed?: NA Specialist Hospital Follow-up appointment confirmed?: NA Do you need transportation to your follow-up appointment?: No Do you understand care options if your condition(s) worsen?: Yes-patient verbalized understanding    SIGNATURE Arvil Persons, BSN, RN

## 2023-09-09 ENCOUNTER — Ambulatory Visit (INDEPENDENT_AMBULATORY_CARE_PROVIDER_SITE_OTHER): Payer: PRIVATE HEALTH INSURANCE | Admitting: Audiology

## 2023-11-09 ENCOUNTER — Telehealth: Payer: Self-pay | Admitting: Otolaryngology

## 2023-11-09 NOTE — Telephone Encounter (Signed)
 pt has not completed all 3 test Dr Larkin Plumb sent him for, pt was called 11-09-23 and a note was put in his mychart

## 2023-11-14 ENCOUNTER — Ambulatory Visit (INDEPENDENT_AMBULATORY_CARE_PROVIDER_SITE_OTHER): Payer: PRIVATE HEALTH INSURANCE | Admitting: Otolaryngology

## 2023-11-18 ENCOUNTER — Encounter: Payer: Self-pay | Admitting: Internal Medicine

## 2023-11-18 ENCOUNTER — Ambulatory Visit: Payer: PRIVATE HEALTH INSURANCE | Attending: Internal Medicine | Admitting: Internal Medicine

## 2023-11-18 VITALS — BP 138/84 | Ht 69.0 in | Wt 202.0 lb

## 2023-11-18 DIAGNOSIS — I10 Essential (primary) hypertension: Secondary | ICD-10-CM

## 2023-11-18 DIAGNOSIS — Z79899 Other long term (current) drug therapy: Secondary | ICD-10-CM | POA: Diagnosis not present

## 2023-11-18 DIAGNOSIS — R9431 Abnormal electrocardiogram [ECG] [EKG]: Secondary | ICD-10-CM | POA: Diagnosis not present

## 2023-11-18 NOTE — Progress Notes (Signed)
Cardiology Office Note:  .   Date:  11/18/2023  ID:  John Chen, DOB [birth year], MRN [id number] PCP: John Sax, MD  [facility] Providers Cardiologist:  Parke Poisson, MD    History of Present Illness: John Chen is a 53 y.o. male.  Discussed the use of AI scribe software for clinical note transcription with the patient, who gave verbal consent to proceed.  History of Present Illness   The patient, with a history of acid reflux and chest discomfort, reports that his acid reflux symptoms have improved but not completely resolved with Prilosec. He has tried other medications and nasal sprays, suspecting allergies as a potential cause, but these did not provide relief and were discontinued. The patient has not experienced any chest discomfort since his last visit and believes that his previous chest discomfort was related to acid reflux. He has been maintaining regular physical activity without any issues.  The patient is also managing his blood pressure with amlodipine 10mg  daily. He has noticed that his blood pressure is sensitive to his diet, particularly his salt intake. The patient has a history of high cholesterol, which he is monitoring. He has noticed that his blood pressure readings can fluctuate, but he attributes this to his dietary habits. He has previously tried chlorthalidone for blood pressure management but discontinued it due to excessive dry mouth and thirst.        ROS: negative except per HPI above.  Studies Reviewed: Marland Kitchen   EKG Interpretation Date/Time:  Friday November 18 2023 16:48:05 EST Ventricular Rate:  62 PR Interval:  138 QRS Duration:  82 QT Interval:  398 QTC Calculation: 403 R Axis:   11  Text Interpretation: Normal sinus rhythm Minimal voltage criteria for LVH, may be normal variant ( Sokolow-Lyon ) Confirmed by Weston Brass ([id number]) on 11/18/2023 5:13:56 PM    Results   RADIOLOGY Coronary CT scan: No blockages in  arteries, no coronary artery calcification, no abnormalities in lungs or bones (Summer 2024)  DIAGNOSTIC EKG: Minimal criteria for left ventricular hypertrophy (11/18/2023)     Risk Assessment/Calculations:         Physical Exam:   VS:  BP 138/84   Ht 5\' 9"  (1.753 m)   Wt 202 lb (91.6 kg)   SpO2 95%   BMI 29.83 kg/m    Wt Readings from Last 3 Encounters:  11/18/23 202 lb (91.6 kg)  08/12/23 195 lb 3.2 oz (88.5 kg)  06/28/23 193 lb 9.6 oz (87.8 kg)         GEN: Well nourished, well developed in no acute distress NECK: No JVD; No carotid bruits CARDIAC: RRR, no murmurs, rubs, gallops RESPIRATORY:  Clear to auscultation without rales, wheezing or rhonchi  ABDOMEN: Soft, non-tender, non-distended EXTREMITIES:  No edema; No deformity   ASSESSMENT AND PLAN: .    Assessment & Plan Essential hypertension  Nonspecific abnormal electrocardiogram (ECG) (EKG)  Medication management   Assessment and Plan    Hypertension Blood pressure slightly elevated, currently on Amlodipine 10mg  daily. Patient reports blood pressure fluctuations at home, possibly related to dietary intake. -Continue Amlodipine 10mg  daily. -Encourage increased water intake and dietary modifications to reduce salt intake. -Check blood pressure at home and monitor for any significant changes. -Plan to reassess need for additional antihypertensive medication (such as Chlorthalidone) based on echocardiogram results and blood pressure control.  Cardiac Health Previous coronary CT scan showed no blockages or calcium in arteries. Recent EKG suggests possible minimal  thickening of heart muscle. -Order echocardiogram to assess heart muscle thickness. -Continue to monitor blood pressure closely due to potential impact on heart muscle thickness.  Hyperlipidemia Previous cholesterol levels were slightly elevated. -Plan to repeat lipid panel and LPa in 6 months prior to next visit.  Follow-up Plan to see patient  in 6 months to reassess blood pressure control, discuss results of echocardiogram and lipid panel.

## 2023-11-18 NOTE — Patient Instructions (Signed)
Medication Instructions:  Your physician recommends that you continue on your current medications as directed. Please refer to the Current Medication list given to you today.  *If you need a refill on your cardiac medications before your next appointment, please call your pharmacy*  Lab Work: Lipid Panel and LPa (Lipoprotein A) FASTING in about 6 months before seeing Dr. Jacques Navy If you have labs (blood work) drawn today and your tests are completely normal, you will receive your results only by: MyChart Message (if you have MyChart) OR A paper copy in the mail If you have any lab test that is abnormal or we need to change your treatment, we will call you to review the results.   Testing/Procedures: Your physician has requested that you have an echocardiogram. Echocardiography is a painless test that uses sound waves to create images of your heart. It provides your doctor with information about the size and shape of your heart and how well your heart's chambers and valves are working. This procedure takes approximately one hour. There are no restrictions for this procedure. Please do NOT wear cologne, perfume, aftershave, or lotions (deodorant is allowed). Please arrive 15 minutes prior to your appointment time. This will take place at [address] [place]    Follow-Up: At [facility], you and your health needs are our priority.  As part of our continuing mission to provide you with exceptional heart care, we have created designated Provider Care Teams.  These Care Teams include your primary Cardiologist (physician) and Advanced Practice Providers (APPs -  Physician Assistants and Nurse Practitioners) who all work together to provide you with the care you need, when you need it.  Your next appointment:   6 month(s)  Provider:   Parke Poisson, MD

## 2023-11-21 ENCOUNTER — Telehealth (INDEPENDENT_AMBULATORY_CARE_PROVIDER_SITE_OTHER): Payer: Self-pay | Admitting: Audiology

## 2023-11-21 NOTE — Telephone Encounter (Signed)
confirmed appt & location [phone] afm

## 2023-11-22 ENCOUNTER — Ambulatory Visit (INDEPENDENT_AMBULATORY_CARE_PROVIDER_SITE_OTHER): Payer: PRIVATE HEALTH INSURANCE | Admitting: Audiology

## 2023-11-22 ENCOUNTER — Ambulatory Visit (HOSPITAL_COMMUNITY): Payer: Managed Care, Other (non HMO) | Attending: Cardiology

## 2023-11-22 DIAGNOSIS — R9431 Abnormal electrocardiogram [ECG] [EKG]: Secondary | ICD-10-CM | POA: Diagnosis present

## 2023-11-22 DIAGNOSIS — I10 Essential (primary) hypertension: Secondary | ICD-10-CM | POA: Insufficient documentation

## 2023-11-22 LAB — ECHOCARDIOGRAM COMPLETE
Area-P 1/2: 3.03 cm2
S' Lateral: 3.2 cm

## 2023-11-22 NOTE — Progress Notes (Deleted)
  [address] [place], [place] [address] [phone]  Audiological Evaluation    Name: John Chen     DOB:   1971/02/27      MRN:   [id number]                                                                                     Service Date: 11/22/2023     Accompanied by: ***   Patient comes today after Dr. Irene Pap, ENT sent a referral for a hearing evaluation due to concerns with {audiology symptoms:31224}.   Symptoms Yes Details  Hearing loss  []    Tinnitus  []    Ear pain/ Ear infections  []    Balance problems  []    Noise exposure  []    Previous ear surgeries  []    Family history  []    Amplification  []    Other  []  Per Dr. Irene Pap - He reports bilateral ringing in his ears for years. R ear feels clogged and he has R > L tinnitus has had it for years.     Otoscopy: Right ear: {otoscopy:31227} Left ear:  {otoscopy:31227}  Tympanometry: Right ear: {tympanometry results:31367} Left ear: {tympanometry results:31367}     Pure tone Audiometry: Right ear- *** {hearing loss types:31372::"sensorineural hearing loss"} from *** Hz - *** Hz. Left ear-  *** {hearing loss types:31372::"sensorineural hearing loss"} from *** Hz - *** Hz.  The hearing test results were completed under {transducer options:31388} and results are deemed to be of {test reliability:31390::"good reliability"}. Test technique:  {audiometric test technique:31400::"conventional"}     Speech Audiometry: Right ear- {AUD SRT/SAT:19348} Left ear-{AUD SRT/SAT:19348}   Word Recognition Score Tested using {word lists:31376::"NU-6 (MLV)"} Right ear: ***% was obtained at a presentation level of *** dBHL with contralateral masking which is deemed as  {word recognition score:31373} Left ear: ***% was obtained at a presentation level of *** dBHL with contralateral masking which is deemed as  {word recognition score:31373}    Impression: {Word recognition Score interpretation:31432::"There is not  a significant difference in pure-tone thresholds between ears.","There is not a significant difference in the word recognition score in between ears. "}   Recommendations: {Audiology Recommendations:31370::"Follow up with ENT as scheduled for today."}   Celedonio Sortino MARIE LEROUX-MARTINEZ, AUD

## 2023-11-24 ENCOUNTER — Telehealth (INDEPENDENT_AMBULATORY_CARE_PROVIDER_SITE_OTHER): Payer: Self-pay | Admitting: Audiology

## 2023-11-24 ENCOUNTER — Other Ambulatory Visit (HOSPITAL_COMMUNITY): Payer: Self-pay | Admitting: *Deleted

## 2023-11-24 ENCOUNTER — Encounter: Payer: Self-pay | Admitting: *Deleted

## 2023-11-24 NOTE — Telephone Encounter (Signed)
Reminder Call: Date: 11/25/2023 Status: Sch  Time: 3:10 PM [address] [place], [place] [address]  Confirmed time and location w/patient.

## 2023-11-25 ENCOUNTER — Ambulatory Visit (INDEPENDENT_AMBULATORY_CARE_PROVIDER_SITE_OTHER): Payer: Managed Care, Other (non HMO) | Admitting: Audiology

## 2023-11-25 DIAGNOSIS — H903 Sensorineural hearing loss, bilateral: Secondary | ICD-10-CM

## 2023-11-25 NOTE — Progress Notes (Signed)
  [address], [address] [place], [place] [address] [phone]  Audiological Evaluation    Name: John Chen     DOB:   [birth year]      MRN:   [id number]                                                                                     Service Date: 11/25/2023     Accompanied by: unaccompanied    Patient comes today after Dr. Irene Pap, ENT sent a referral for a hearing evaluation due to concerns with tinnitus.   Symptoms Yes Details  Hearing loss  [x]  Maybe cannot understand as well  Tinnitus  [x]  Right ear -comes and goes. For some time also had like a constant humming.  Ear pain/ Ear infections  []    Balance problems  []    Noise exposure  [x]  Occupational noise history (had hearing screenings) and music  Previous ear surgeries  []    Family history  []    Amplification  []    Other  [x]  Per Dr. Irene Pap - He reports bilateral ringing in his ears for years. R ear feels clogged and he has R > L tinnitus has had it for years.     Otoscopy: Right ear: Clear external ear canals and notable landmarks visualized on the tympanic membrane. Left ear:  Clear external ear canals and notable landmarks visualized on the tympanic membrane.  Tympanometry: Right ear: Type A- Normal external ear canal volume with normal middle ear pressure and tympanic membrane compliance Left ear: Type A- Normal external ear canal volume with normal middle ear pressure and tympanic membrane compliance     Pure tone Audiometry: Normal hearing from [phone], then mild presumably sensorineural hearing loss at 8000 Hz, in both ears.  Of note, high frequency 12,000 Hz response in the right ear was at 55dBHL and in the left ear at 50dBHL.     The hearing test results were completed under headphones and re-checked with inserts and results are deemed to be of good reliability. Test technique:  conventional     Speech Audiometry: Right ear- Speech Reception Threshold (SRT) was obtained at 5 dBHL Left  ear-Speech Reception Threshold (SRT) was obtained at 5 dBHL   Word Recognition Score Tested using NU-6 (MLV) Right ear: 92% was obtained at a presentation level of 55 dBHL with contralateral masking which is deemed as  excellent Left ear: 100% was obtained at a presentation level of 55 dBHL with contralateral masking which is deemed as  excellent     Recommendations: Follow up with ENT as scheduled for today. Return for a hearing evaluation if concerns with hearing changes arise or per MD recommendation. Use hearing protection when exposed to loud/damaging sounds. Consider the use of a sound generator (fan , white noise, etc) if needed to reduce tinnitus perception.  [name] MARIE LEROUX-MARTINEZ, AUD

## 2023-11-28 ENCOUNTER — Other Ambulatory Visit (HOSPITAL_COMMUNITY): Payer: Self-pay | Admitting: *Deleted

## 2023-11-28 DIAGNOSIS — R9431 Abnormal electrocardiogram [ECG] [EKG]: Secondary | ICD-10-CM

## 2023-12-07 ENCOUNTER — Ambulatory Visit (INDEPENDENT_AMBULATORY_CARE_PROVIDER_SITE_OTHER): Payer: PRIVATE HEALTH INSURANCE | Admitting: Otolaryngology

## 2023-12-13 ENCOUNTER — Encounter: Payer: Self-pay | Admitting: Gastroenterology

## 2023-12-13 ENCOUNTER — Ambulatory Visit: Payer: Managed Care, Other (non HMO) | Admitting: Gastroenterology

## 2023-12-13 VITALS — BP 130/80 | HR 68 | Ht 69.0 in | Wt 195.0 lb

## 2023-12-13 DIAGNOSIS — K219 Gastro-esophageal reflux disease without esophagitis: Secondary | ICD-10-CM | POA: Diagnosis not present

## 2023-12-13 DIAGNOSIS — K5900 Constipation, unspecified: Secondary | ICD-10-CM | POA: Diagnosis not present

## 2023-12-13 DIAGNOSIS — R1032 Left lower quadrant pain: Secondary | ICD-10-CM | POA: Diagnosis not present

## 2023-12-13 MED ORDER — PANTOPRAZOLE SODIUM 40 MG PO TBEC: 40.0000 mg | DELAYED_RELEASE_TABLET | Freq: Every day | ORAL | 2 refills | Status: DC

## 2023-12-13 NOTE — Progress Notes (Signed)
Chief Complaint: Acid reflux Primary GI MD: Dr. Tomasa Rand  HPI: 53 year old male with history as listed below presents for evaluation of acid reflux  Last seen in 2023 by Dr. Tomasa Rand and at that time was having some lower abdominal pain with diarrhea.  Workup showed negative celiac and normal labs.  He was recommended to do a fecal calprotectin but this was not returned.  He was provided reassurance.  History of colonoscopy in 2016 which was normal. ----------------------TODAY-------------------------- He has been experiencing worsening acid reflux over the past few months. Despite taking over-the-counter Prilosec 20 mg, his symptoms persist. The reflux occurs frequently, especially at night, causing him to wake up with a burning sensation in his chest and sometimes leading to temporary voice loss. Dietary modifications, such as avoiding eating before bed and sleeping on his left side, provide some relief.  He experiences left lower abdominal pain almost daily, described as sharp and random, sometimes relieved by bowel movements. The pain occurs regardless of eating habits and can last from a few minutes to several hours. He associates the pain with constipation, experiencing hard stools and straining during bowel movements. He consumes protein shakes in the morning, which he believes may contribute to constipation. He avoids using the bathroom at work, leading to holding bowel movements until home, resulting in urgency.  He underwent a colonoscopy in 2016, which showed no polyps. He is not due for another colonoscopy until 2026.      Past Medical History:  Diagnosis Date   GERD (gastroesophageal reflux disease)    Hypertension     Past Surgical History:  Procedure Laterality Date   NO PAST SURGERIES      Current Outpatient Medications  Medication Sig Dispense Refill   amLODipine (NORVASC) 10 MG tablet Take 1 tablet (10 mg total) by mouth daily. 90 tablet 3   omeprazole (PRILOSEC  OTC) 20 MG tablet Take 20 mg by mouth daily.     famotidine (PEPCID) 20 MG tablet Take 1 tablet (20 mg total) by mouth 2 (two) times daily. (Patient not taking: Reported on 12/13/2023) 30 tablet 1   No current facility-administered medications for this visit.    Allergies as of 12/13/2023 - Review Complete 12/13/2023  Allergen Reaction Noted   Amoxicillin Rash 03/07/2023    Family History  Problem Relation Age of Onset   Hypertension Mother    Hypertension Father    Asthma Sister    Hypertension Sister    Asthma Sister    Drug abuse Brother    Early death Brother    Heart attack Brother    Hypertension Maternal Grandmother    Alcohol abuse Neg Hx    Cancer Neg Hx    Diabetes Neg Hx    Hearing loss Neg Hx    Heart disease Neg Hx    Hyperlipidemia Neg Hx    Kidney disease Neg Hx    Stroke Neg Hx    Colon cancer Neg Hx    Stomach cancer Neg Hx    Esophageal cancer Neg Hx    Colon polyps Neg Hx     Social History   Socioeconomic History   Marital status: Married    Spouse name: Not on file   Number of children: 5   Years of education: Not on file   Highest education level: GED or equivalent  Occupational History   Not on file  Tobacco Use   Smoking status: Never   Smokeless tobacco: Never  Vaping Use  Vaping status: Never Used  Substance and Sexual Activity   Alcohol use: Yes    Alcohol/week: 1.0 standard drink of alcohol    Types: 1 Cans of beer per week    Comment: daily after work, craft beer   Drug use: No   Sexual activity: Yes  Other Topics Concern   Not on file  Social History Narrative   Not on file   Social Drivers of Health   Financial Resource Strain: Low Risk  (06/24/2023)   Overall Financial Resource Strain (CARDIA)    Difficulty of Paying Living Expenses: Not very hard  Food Insecurity: No Food Insecurity (06/24/2023)   Hunger Vital Sign    Worried About Running Out of Food in the Last Year: Never true    Ran Out of Food in the Last Year:  Never true  Transportation Needs: No Transportation Needs (06/24/2023)   PRAPARE - Administrator, Civil Service (Medical): No    Lack of Transportation (Non-Medical): No  Physical Activity: Sufficiently Active (06/24/2023)   Exercise Vital Sign    Days of Exercise per Week: 2 days    Minutes of Exercise per Session: 130 min  Stress: No Stress Concern Present (06/24/2023)   [employer] of Occupational Health - Occupational Stress Questionnaire    Feeling of Stress : Only a little  Social Connections: Moderately Integrated (06/24/2023)   Social Connection and Isolation Panel [NHANES]    Frequency of Communication with Friends and Family: More than three times a week    Frequency of Social Gatherings with Friends and Family: More than three times a week    Attends Religious Services: More than 4 times per year    Active Member of [employer] or Organizations: Yes    Attends Engineer, structural: More than 4 times per year    Marital Status: Separated  Intimate Partner Violence: Not on file    Review of Systems:    Constitutional: No weight loss, fever, chills, weakness or fatigue HEENT: Eyes: No change in vision               Ears, Nose, Throat:  No change in hearing or congestion Skin: No rash or itching Cardiovascular: No chest pain, chest pressure or palpitations   Respiratory: No SOB or cough Gastrointestinal: See HPI and otherwise negative Genitourinary: No dysuria or change in urinary frequency Neurological: No headache, dizziness or syncope Musculoskeletal: No new muscle or joint pain Hematologic: No bleeding or bruising Psychiatric: No history of depression or anxiety    Physical Exam:  Vital signs: BP 130/80   Pulse 68   Ht 5\' 9"  (1.753 m)   Wt 195 lb (88.5 kg)   BMI 28.80 kg/m   Constitutional: NAD, Well developed, Well nourished, alert and cooperative Head:  Normocephalic and atraumatic. Eyes:   PEERL, EOMI. No icterus. Conjunctiva  pink. Respiratory: Respirations even and unlabored. Lungs clear to auscultation bilaterally.   No wheezes, crackles, or rhonchi.  Cardiovascular:  Regular rate and rhythm. No peripheral edema, cyanosis or pallor.  Gastrointestinal:  Soft, nondistended, nontender. No rebound or guarding.  Somewhat sluggish bowel sounds. No appreciable masses or hepatomegaly. Rectal:  Not performed.  Msk:  Symmetrical without gross deformities. Without edema, no deformity or joint abnormality.  Neurologic:  Alert and  oriented x4;  grossly normal neurologically.  Skin:   Dry and intact without significant lesions or rashes. Psychiatric: Oriented to person, place and time. Demonstrates good judgement and reason without abnormal affect  or behaviors.   RELEVANT LABS AND IMAGING: CBC    Component Value Date/Time   WBC 5.6 08/19/2023 1413   RBC 4.94 08/19/2023 1413   HGB 15.0 08/19/2023 1413   HGB 14.0 01/07/2021 1658   HGB 12.9 (L) 03/21/2008 1048   HCT 45.3 08/19/2023 1413   HCT 40.6 01/07/2021 1658   HCT 38.1 (L) 03/21/2008 1048   PLT 215 08/19/2023 1413   PLT 200 01/07/2021 1658   MCV 91.7 08/19/2023 1413   MCV 90 01/07/2021 1658   MCV 91.7 03/21/2008 1048   MCH 30.4 08/19/2023 1413   MCHC 33.1 08/19/2023 1413   RDW 11.3 (L) 08/19/2023 1413   RDW 12.2 01/07/2021 1658   RDW 13.1 03/21/2008 1048   LYMPHSABS 1.6 01/11/2021 1136   LYMPHSABS 1.8 01/07/2021 1658   LYMPHSABS 1.4 03/21/2008 1048   MONOABS 0.5 01/11/2021 1136   MONOABS 0.5 03/21/2008 1048   EOSABS 0.1 01/11/2021 1136   EOSABS 0.1 01/07/2021 1658   BASOSABS 0.1 01/11/2021 1136   BASOSABS 0.1 01/07/2021 1658   BASOSABS 0.0 03/21/2008 1048    CMP     Component Value Date/Time   NA 136 08/19/2023 1413   NA 142 03/11/2023 1520   K 4.0 08/19/2023 1413   CL 101 08/19/2023 1413   CO2 26 08/19/2023 1413   GLUCOSE 176 (H) 08/19/2023 1413   BUN 19 08/19/2023 1413   BUN 16 03/11/2023 1520   CREATININE 1.23 08/19/2023 1413    CREATININE 1.20 07/10/2021 1633   CALCIUM 9.5 08/19/2023 1413   PROT 7.7 08/19/2023 1413   PROT 7.6 01/07/2021 1658   ALBUMIN 4.3 08/19/2023 1413   ALBUMIN 5.1 (H) 01/07/2021 1658   AST 35 08/19/2023 1413   ALT 41 08/19/2023 1413   ALKPHOS 45 08/19/2023 1413   BILITOT 0.9 08/19/2023 1413   BILITOT 0.5 01/07/2021 1658   GFRNONAA >60 08/19/2023 1413     Assessment/Plan:      LLQ pain Constipation Left lower quadrant pain, random in nature, sometimes associated with eating or bowel movements. Possible IBS or constipation related due to high protein diet and hard stools. -- Recommend fiber supplement and possibly Miralax to soften stools. --Consider IB guard, provided samples - Squatty potty to help facilitate bowel movements -- Encourage regular bowel movements in which he "adheres to the call to stool" and not delaying them will also aid in his constipation.  GERD Chronic symptoms, worsening over the past few months. Current treatment with Prilosec 20mg  OTC not effective. Nocturnal symptoms present.  Patient concerned about cancer.  No previous EGD.  Due to longstanding history of his GERD and no previous EGD I think it is reasonable for endoscopic evaluation to rule out Barrett's.  No NSAIDs - Pantoprazole 40 Mg once daily - EGD for further evaluation of esophagitis, gastritis, PUD -- Advise lifestyle modifications: avoid eating close to bedtime, consider wedge pillow for sleeping. -- Provided patient education handouts - I thoroughly discussed the procedure with the patient (at bedside) to include nature of the procedure, alternatives, benefits, and risks (including but not limited to bleeding, infection, perforation, anesthesia/cardiac pulmonary complications).  Patient verbalized understanding and gave verbal consent to proceed with procedure.   General Health Maintenance / Followup Plans -Colonoscopy due in 2026, unless symptoms change. -Follow-up in 8-12 weeks or sooner if  symptoms worsen.      Lara Mulch [facility] 12/13/2023, 11:19 AM  Cc: Ashok Croon, MD

## 2023-12-13 NOTE — Patient Instructions (Addendum)
You have been scheduled for an endoscopy. Please follow written instructions given to you at your visit today.  If you use inhalers (even only as needed), please bring them with you on the day of your procedure. ____________________________________________  We have your follow up appointment scheduled with Boone Master, PA for 02/07/24 at 10:40 AM.  Due to recent changes in healthcare laws, you may see the results of your imaging and laboratory studies on MyChart before your provider has had a chance to review them.  We understand that in some cases there may be results that are confusing or concerning to you. Not all laboratory results come back in the same time frame and the provider may be waiting for multiple results in order to interpret others.  Please give [place] 48 hours in order for your provider to thoroughly review all the results before contacting the office for clarification of your results.   Thank you for trusting me with your gastrointestinal care!   Boone Master, PA

## 2023-12-16 ENCOUNTER — Encounter (INDEPENDENT_AMBULATORY_CARE_PROVIDER_SITE_OTHER): Payer: Self-pay | Admitting: Otolaryngology

## 2023-12-16 ENCOUNTER — Ambulatory Visit (INDEPENDENT_AMBULATORY_CARE_PROVIDER_SITE_OTHER): Payer: Managed Care, Other (non HMO) | Admitting: Otolaryngology

## 2023-12-16 VITALS — BP 128/71 | HR 63

## 2023-12-16 DIAGNOSIS — R09A2 Foreign body sensation, throat: Secondary | ICD-10-CM

## 2023-12-16 DIAGNOSIS — R0982 Postnasal drip: Secondary | ICD-10-CM | POA: Diagnosis not present

## 2023-12-16 DIAGNOSIS — H9313 Tinnitus, bilateral: Secondary | ICD-10-CM

## 2023-12-16 DIAGNOSIS — H903 Sensorineural hearing loss, bilateral: Secondary | ICD-10-CM

## 2023-12-16 DIAGNOSIS — K219 Gastro-esophageal reflux disease without esophagitis: Secondary | ICD-10-CM | POA: Diagnosis not present

## 2023-12-16 DIAGNOSIS — J3089 Other allergic rhinitis: Secondary | ICD-10-CM

## 2023-12-16 DIAGNOSIS — R0981 Nasal congestion: Secondary | ICD-10-CM

## 2023-12-16 NOTE — Patient Instructions (Signed)

## 2023-12-16 NOTE — Progress Notes (Signed)
ENT Progress Note:   Update 12/16/2023  Discussed the use of AI scribe software for clinical note transcription with the patient, who gave verbal consent to proceed.  History of Present Illness   John Chen is a 53 year old male who presents with hearing test result review and ongoing acid reflux symptoms/cough.  He underwent a hearing test which revealed very mild high-frequency hearing loss, symmetric and bilateral only affecting high-freq range sounds. Middle ear function was good, Type A tymps.   He experiences ongoing acid reflux symptoms. Pepcid was prescribed but did not alleviate his symptoms, and another medication also failed to provide relief. He does not consume soda, and he typically eats dinner around 8 PM, going to bed around 11 or 11:30 PM. He is scheduled with GI for EGD.   He has a dry cough, which he believes may be related to reflux and postnasal drainage. He previously used Flonase and Zyrtec for about one and a half to two months, but did not find them helpful and discontinued.     Records Reviewed:  Office visit with GI 12/13/23 Last seen in 2023 by Dr. Tomasa Rand and at that time was having some lower abdominal pain with diarrhea.  Workup showed negative celiac and normal labs.  He was recommended to do a fecal calprotectin but this was not returned.  He was provided reassurance.  History of colonoscopy in 2016 which was normal. ----------------------TODAY-------------------------- He has been experiencing worsening acid reflux over the past few months. Despite taking over-the-counter Prilosec 20 mg, his symptoms persist. The reflux occurs frequently, especially at night, causing him to wake up with a burning sensation in his chest and sometimes leading to temporary voice loss. Dietary modifications, such as avoiding eating before bed and sleeping on his left side, provide some relief.   He experiences left lower abdominal pain almost daily, described as sharp and  random, sometimes relieved by bowel movements. The pain occurs regardless of eating habits and can last from a few minutes to several hours. He associates the pain with constipation, experiencing hard stools and straining during bowel movements. He consumes protein shakes in the morning, which he believes may contribute to constipation. He avoids using the bathroom at work, leading to holding bowel movements until home, resulting in urgency.   He underwent a colonoscopy in 2016, which showed no polyps. He is not due for another colonoscopy until 2026.   GERD Chronic symptoms, worsening over the past few months. Current treatment with Prilosec 20mg  OTC not effective. Nocturnal symptoms present.  Patient concerned about cancer.  No previous EGD.  Due to longstanding history of his GERD and no previous EGD I think it is reasonable for endoscopic evaluation to rule out Barrett's.  No NSAIDs - Pantoprazole 40 Mg once daily - EGD for further evaluation of esophagitis, gastritis, PUD -- Advise lifestyle modifications: avoid eating close to bedtime, consider wedge pillow for sleeping. -- Provided patient education handouts - I thoroughly discussed the procedure with the patient (at bedside) to include nature of the procedure, alternatives, benefits, and risks (including but not limited to bleeding, infection, perforation, anesthesia/cardiac pulmonary complications).  Patient verbalized understanding and gave verbal consent to proceed with procedure.     Initial Evaluation by me  Reason for Consult: sore throat/globus/burning sensation x 2 months and ringing in his ears  HPI: John Chen is an 53 y.o. male with hx cervical radiculopathy, reactive airway disease, here for evaluation of throat discomfort globus sensation and frequent burning  in his throat for 2 months as well as ringing in his ears. He reports sore throat/raw sensation in his throat and it gets better intermittently, but gets worse again.   Symptoms present for x 2 months. He had reflux medications in the past. He eats late dinner. He had heartburn years ago, and his sx are different this time. He also has dry cough and lump sensation in his throat. He is on Nexium and it is helping. He tried to eat earlier x last 2 months, and symptoms are better no. If he eats foods that trigger reflux, his throat sx get worse.  He struggles to swallow food primarily solid foods when his symptoms are severe. He reports bilateral ringing in his ears for years. R ear feels clogged and he has R > L tinnitus has had it for years.  No ear pain, no ear drainage, no prior ear surgeries, no prior history of recurrent ear infections.  No recent hearing evaluation.  Records Reviewed:  PCP office note by Dr Nadene Rubins,   Sore thorat and burning x 1 month. Pt complains of burning in chest as well with headache a fatigue.    Presents with a 1 month history of sore throat in the back of his mouth. He denies headaches, fever or chills, postnasal drip, rhinorrhea, reflux, myalgias, nausea. There is been mild burning in his chest. What little cough he is experienced has been dry. There is been no wheezing or difficulty breathing. No asthma history. Recently separated and is under expected stress. No one is ever complained about snoring. Has not been sleeping as well after the separation. Mouth and nose are dry. No history of fall allergies.   Sore throat -     POCT rapid strep A -     Azithromycin; Take 2 tablets on day 1, then 1 tablet daily on days 2 through 5  Dispense: 6 tablet; Refill: 0 -     Ambulatory referral to ENT -     DG Chest 2 View; Future   Essential hypertension -     amLODIPine Besylate; Take 1 tablet (10 mg total) by mouth daily.  Dispense: 90 tablet; Refill: 3   Past Medical History:  Diagnosis Date   GERD (gastroesophageal reflux disease)    Hypertension     Past Surgical History:  Procedure Laterality Date   NO PAST SURGERIES       Family History  Problem Relation Age of Onset   Hypertension Mother    Hypertension Father    Asthma Sister    Hypertension Sister    Asthma Sister    Drug abuse Brother    Early death Brother    Heart attack Brother    Hypertension Maternal Grandmother    Alcohol abuse Neg Hx    Cancer Neg Hx    Diabetes Neg Hx    Hearing loss Neg Hx    Heart disease Neg Hx    Hyperlipidemia Neg Hx    Kidney disease Neg Hx    Stroke Neg Hx    Colon cancer Neg Hx    Stomach cancer Neg Hx    Esophageal cancer Neg Hx    Colon polyps Neg Hx     Social History:  reports that he has never smoked. He has never used smokeless tobacco. He reports current alcohol use of about 1.0 standard drink of alcohol per week. He reports that he does not use drugs.  Allergies:  Allergies  Allergen Reactions  Amoxicillin Rash    Medications: I have reviewed the patient's current medications.  The PMH, PSH, Medications, Allergies, and SH were reviewed and updated.  ROS: Constitutional: Negative for fever, weight loss and weight gain. Cardiovascular: Negative for chest pain and dyspnea on exertion. Respiratory: Is not experiencing shortness of breath at rest. Gastrointestinal: Negative for nausea and vomiting. Neurological: Negative for headaches. Psychiatric: The patient is not nervous/anxious  Blood pressure 128/71, pulse 63, SpO2 99%.  PHYSICAL EXAM:  Exam: General: Well-developed, well-nourished Respiratory Respiratory effort: Equal inspiration and expiration without stridor Cardiovascular Peripheral Vascular: Warm extremities with equal color/perfusion Eyes: No nystagmus with equal extraocular motion bilaterally Neuro/Psych/Balance: Patient oriented to person, place, and time; Appropriate mood and affect; Gait is intact with no imbalance; Cranial nerves I-XII are intact Head and Face Inspection: Normocephalic and atraumatic without mass or lesion Facial Strength: Facial motility  symmetric and full bilaterally ENT Pinna: External ear intact and fully developed External canal: Canal is patent with intact skin Lips, Teeth, and gums: Mucosa and teeth intact and viable Neck Neck and Trachea: Midline trachea without mass or lesion Thyroid: No mass or nodularity Lymphatics: No lymphadenopathy  Audiogram 11/25/23 Tympanometry: Right ear: Type A- Normal external ear canal volume with normal middle ear pressure and tympanic membrane compliance Left ear: Type A- Normal external ear canal volume with normal middle ear pressure and tympanic membrane compliance   Pure tone Audiometry: Normal hearing from 270 512 1691 Hz, then mild presumably sensorineural hearing loss at 8000 Hz, in both ears.   Of note, high frequency 12,000 Hz response in the right ear was at 55dBHL and in the left ear at 50dBHL    Assessment/Plan: Encounter Diagnoses  Name Primary?   Sensorineural hearing loss, bilateral Yes   Tinnitus of both ears    Globus sensation    Chronic GERD    Chronic nasal congestion    Environmental and seasonal allergies    Post-nasal drip     Chronic throat discomfort globus sensation dry cough and intermittent dysphagia.  Flexible laryngoscopy with evidence of GERD LPR otherwise no concerning findings.  -Suspect untreated GERD LPR is the primary etiology for his symptoms -Screening esophagram to rule out esophageal pathology and risk factors for severe reflux -Trial of Pepcid 20 mg twice daily plus reflux Gourmet -Diet and lifestyle changes to minimize reflux -He is concerned about symptoms and would like to rule out esophageal cancer-due to severe symptoms of heartburn and GERD LPR, will refer to GI for upper endoscopy to rule out Barrett's  2.  Intermittent dry cough in the setting of history of reactive airway disease, evidence of nasal congestion and postnasal drainage on scope exam today.  Also reports sensation of clogged ears or ear fullness.  Suspect element  of seasonal versus environmental allergies and eustachian tube dysfunction. - start Flonase 2 puffs bilateral nares twice daily and Zyrtec 10 mg daily  3.  Bilateral tinnitus for years-DDx includes eustachian tube dysfunction versus conductive versus sensorineural hearing loss - schedule Audiogram  Return after testing  Update 12/16/2023 Assessment and Plan    Sensorineural Hearing Loss b/l symmetric mild Mild high-frequency hearing loss noted on recent audiogram. Middle ear function is normal. Likely age-related and possibly exacerbated by noise exposure. - Advise use of hearing protection during noise exposure (e.g., mowing grass/working outside) - No indication for hearing aids at this time  Chronic throat clearing and globus sensation dry cough - previously scoped without concerning findings  Dry cough likely due to a  combination of GERD and postnasal drainage. Previous treatment with Flonase and Zyrtec was not effective after 1.5-2 months. Discussed the importance of continuing medication to potentially decrease cough duration and frequency. - Continue Flonase and Zyrtec if able  Chronic Gastroesophageal Reflux Disease (GERD) Persistent acid reflux despite treatment with Pepcid, changed to PPI by GI. He is scheduled for an upper endoscopy with GI to evaluate for esophageal and gastric pathology. Dietary modifications discussed. Discussed potential for switching reflux medication based on GI recommendations. - Continue current reflux medication - Proceed with GI evaluation and upper endoscopy - Advise dietary modifications to avoid reflux triggers (e.g., fried foods, spicy foods, high-fat content foods - Continue reflux gourmet   Follow-up - Follow up with GI for results of upper endoscopy - Return to clinic as needed.     Ashok Croon, MD Otolaryngology [facility] Phone: [phone] Fax: [phone]    12/16/2023, 11:21 AM

## 2023-12-16 NOTE — Progress Notes (Signed)
 Agree with the assessment and plan as outlined by Boone Master, PA-C.

## 2024-01-20 ENCOUNTER — Encounter: Payer: Self-pay | Admitting: Gastroenterology

## 2024-01-20 ENCOUNTER — Ambulatory Visit: Payer: PRIVATE HEALTH INSURANCE | Admitting: Gastroenterology

## 2024-01-20 VITALS — BP 119/68 | HR 61 | Temp 97.3°F | Resp 13 | Ht 69.0 in | Wt 195.0 lb

## 2024-01-20 DIAGNOSIS — K219 Gastro-esophageal reflux disease without esophagitis: Secondary | ICD-10-CM

## 2024-01-20 DIAGNOSIS — R1032 Left lower quadrant pain: Secondary | ICD-10-CM

## 2024-01-20 DIAGNOSIS — K3189 Other diseases of stomach and duodenum: Secondary | ICD-10-CM | POA: Diagnosis present

## 2024-01-20 DIAGNOSIS — K449 Diaphragmatic hernia without obstruction or gangrene: Secondary | ICD-10-CM

## 2024-01-20 MED ORDER — PANTOPRAZOLE SODIUM 40 MG PO TBEC: 40.0000 mg | DELAYED_RELEASE_TABLET | Freq: Two times a day (BID) | ORAL | 3 refills | Status: DC

## 2024-01-20 MED ORDER — SODIUM CHLORIDE 0.9 % IV SOLN: 500.0000 mL | Freq: Once | INTRAVENOUS | Status: AC

## 2024-01-20 NOTE — Progress Notes (Signed)
 Called to room to assist during endoscopic procedure.  Patient ID and intended procedure confirmed with present staff. Received instructions for my participation in the procedure from the performing physician.

## 2024-01-20 NOTE — Progress Notes (Signed)
 Pt's states no medical or surgical changes since previsit or office visit.

## 2024-01-20 NOTE — Progress Notes (Signed)
 [facility] History and Physical   Primary Care Physician:  Mliss Sax, MD   Reason for Procedure:   GERD symptoms persistent despite therapy  Plan:    EGD     HPI: Denton Derks is a 53 y.o. male undergoing EGD to evaluate persistent GERD symptoms not responsive to omeprazole.  He was switched to pantoprazole last month which he takes daily, but still continues to experience burning pain in the chest and throat on a daily basis.   Past Medical History:  Diagnosis Date   GERD (gastroesophageal reflux disease)    Hypertension     Past Surgical History:  Procedure Laterality Date   NO PAST SURGERIES      Prior to Admission medications   Medication Sig Start Date End Date Taking? Authorizing Provider  amLODipine (NORVASC) 10 MG tablet Take 1 tablet (10 mg total) by mouth daily. 06/28/23  Yes Mliss Sax, MD  pantoprazole (PROTONIX) 40 MG tablet Take 1 tablet (40 mg total) by mouth daily. 12/13/23  Yes McMichael, Saddie Benders, PA-C    Current Outpatient Medications  Medication Sig Dispense Refill   amLODipine (NORVASC) 10 MG tablet Take 1 tablet (10 mg total) by mouth daily. 90 tablet 3   pantoprazole (PROTONIX) 40 MG tablet Take 1 tablet (40 mg total) by mouth daily. 30 tablet 2   Current Facility-Administered Medications  Medication Dose Route Frequency Provider Last Rate Last Admin   0.9 %  sodium chloride infusion  500 mL Intravenous Once Jenel Lucks, MD        Allergies as of 01/20/2024 - Review Complete 01/20/2024  Allergen Reaction Noted   Amoxicillin Rash 03/07/2023    Family History  Problem Relation Age of Onset   Hypertension Mother    Hypertension Father    Asthma Sister    Hypertension Sister    Asthma Sister    Drug abuse Brother    Early death Brother    Heart attack Brother    Hypertension Maternal Grandmother    Alcohol abuse Neg Hx    Cancer Neg Hx    Diabetes Neg Hx    Hearing loss Neg Hx    Heart  disease Neg Hx    Hyperlipidemia Neg Hx    Kidney disease Neg Hx    Stroke Neg Hx    Colon cancer Neg Hx    Stomach cancer Neg Hx    Esophageal cancer Neg Hx    Colon polyps Neg Hx     Social History   Socioeconomic History   Marital status: Married    Spouse name: Not on file   Number of children: 5   Years of education: Not on file   Highest education level: GED or equivalent  Occupational History   Not on file  Tobacco Use   Smoking status: Never   Smokeless tobacco: Never  Vaping Use   Vaping status: Never Used  Substance and Sexual Activity   Alcohol use: Yes    Alcohol/week: 1.0 standard drink of alcohol    Types: 1 Cans of beer per week    Comment: daily after work, craft beer   Drug use: No   Sexual activity: Yes  Other Topics Concern   Not on file  Social History Narrative   Not on file   Social Drivers of Health   Financial Resource Strain: Low Risk  (06/24/2023)   Overall Financial Resource Strain (CARDIA)    Difficulty of Paying Living Expenses: Not very  hard  Food Insecurity: No Food Insecurity (06/24/2023)   Hunger Vital Sign    Worried About Running Out of Food in the Last Year: Never true    Ran Out of Food in the Last Year: Never true  Transportation Needs: No Transportation Needs (06/24/2023)   PRAPARE - Administrator, Civil Service (Medical): No    Lack of Transportation (Non-Medical): No  Physical Activity: Sufficiently Active (06/24/2023)   Exercise Vital Sign    Days of Exercise per Week: 2 days    Minutes of Exercise per Session: 130 min  Stress: No Stress Concern Present (06/24/2023)   [employer] of Occupational Health - Occupational Stress Questionnaire    Feeling of Stress : Only a little  Social Connections: Moderately Integrated (06/24/2023)   Social Connection and Isolation Panel [NHANES]    Frequency of Communication with Friends and Family: More than three times a week    Frequency of Social Gatherings with  Friends and Family: More than three times a week    Attends Religious Services: More than 4 times per year    Active Member of [employer] or Organizations: Yes    Attends Engineer, structural: More than 4 times per year    Marital Status: Separated  Intimate Partner Violence: Not on file    Review of Systems:  All other review of systems negative except as mentioned in the HPI.  Physical Exam: Vital signs BP 125/71   Pulse (!) 58   Temp (!) 97.3 F (36.3 C) (Temporal)   Ht 5\' 9"  (1.753 m)   Wt 195 lb (88.5 kg)   SpO2 97%   BMI 28.80 kg/m   General:   Alert,  Well-developed, well-nourished, pleasant and cooperative in NAD Airway:  Mallampati 2 Lungs:  Clear throughout to auscultation.   Heart:  Regular rate and rhythm; no murmurs, clicks, rubs,  or gallops. Abdomen:  Soft, nontender and nondistended. Normal bowel sounds.   Neuro/Psych:  Normal mood and affect. A and O x 3   Myrtie Leuthold E. Tomasa Rand, MD [facility]

## 2024-01-20 NOTE — Patient Instructions (Addendum)
 YOU HAD AN ENDOSCOPIC PROCEDURE TODAY AT THE [facility]:   Refer to the procedure report that was given to you for any specific questions about what was found during the examination.  If the procedure report does not answer your questions, please call your gastroenterologist to clarify.  If you requested that your care partner not be given the details of your procedure findings, then the procedure report has been included in a sealed envelope for you to review at your convenience later.  YOU SHOULD EXPECT: Some feelings of bloating in the abdomen. Passage of more gas than usual.  Walking can help get rid of the air that was put into your GI tract during the procedure and reduce the bloating. If you had a lower endoscopy (such as a colonoscopy or flexible sigmoidoscopy) you may notice spotting of blood in your stool or on the toilet paper. If you underwent a bowel prep for your procedure, you may not have a normal bowel movement for a few days.  Please Note:  You might notice some irritation and congestion in your nose or some drainage.  This is from the oxygen used during your procedure.  There is no need for concern and it should clear up in a day or so.  SYMPTOMS TO REPORT IMMEDIATELY:   Following upper endoscopy (EGD)  Vomiting of blood or coffee ground material  New chest pain or pain under the shoulder blades  Painful or persistently difficult swallowing  New shortness of breath  Fever of 100F or higher  Black, tarry-looking stools  For urgent or emergent issues, a gastroenterologist can be reached at any hour by calling [phone]. Do not use MyChart messaging for urgent concerns.    DIET:  We do recommend a small meal at first, but then you may proceed to your regular diet.  Drink plenty of fluids but you should avoid alcoholic beverages for 24 hours.  MEDICATIONS: Continue present medications. Recommend trial of Protonix 40 mg twice daily or adding Pepcid 40mg  daily  at bedtime.  FOLLOW UP: Await pathology results.  Please see handouts given to you by your recovery nurse: Hiatal Hernia.  Thank you for allowing [place] to provide  for your healthcare needs today.  ACTIVITY:  You should plan to take it easy for the rest of today and you should NOT DRIVE or use heavy machinery until tomorrow (because of the sedation medicines used during the test).    FOLLOW UP: Our staff will call the number listed on your records the next business day following your procedure.  We will call around 7:15- 8:00 am to check on you and address any questions or concerns that you may have regarding the information given to you following your procedure. If we do not reach you, we will leave a message.     If any biopsies were taken you will be contacted by phone or by letter within the next 1-3 weeks.  Please call us at [phone] if you have not heard about the biopsies in 3 weeks.    SIGNATURES/CONFIDENTIALITY: You and/or your care partner have signed paperwork which will be entered into your electronic medical record.  These signatures attest to the fact that that the information above on your After Visit Summary has been reviewed and is understood.  Full responsibility of the confidentiality of this discharge information lies with you and/or your care-partner.

## 2024-01-20 NOTE — Progress Notes (Signed)
 A/o x 3, VSS, gd SR's, pleased with anesthesia, report to RN

## 2024-01-20 NOTE — Op Note (Signed)
  [facility] Patient Name: John Chen Procedure Date: 01/20/2024 10:17 AM MRN: [id number] Endoscopist: Lorin Picket E. Tomasa Rand , MD, [id number] Age: 53 Referring MD:  Date of Birth: [birth year] Gender: Male Account #: [id number] Procedure:                Upper GI endoscopy Indications:              Esophageal reflux symptoms that persist despite                            appropriate therapy Medicines:                Monitored Anesthesia Care Procedure:                Pre-Anesthesia Assessment:                           - Prior to the procedure, a History and Physical                            was performed, and patient medications and                            allergies were reviewed. The patient's tolerance of                            previous anesthesia was also reviewed. The risks                            and benefits of the procedure and the sedation                            options and risks were discussed with the patient.                            All questions were answered, and informed consent                            was obtained. Prior Anticoagulants: The patient has                            taken no anticoagulant or antiplatelet agents. ASA                            Grade Assessment: II - A patient with mild systemic                            disease. After reviewing the risks and benefits,                            the patient was deemed in satisfactory condition to                            undergo the procedure.  After obtaining informed consent, the endoscope was                            passed under direct vision. Throughout the                            procedure, the patient's blood pressure, pulse, and                            oxygen saturations were monitored continuously. The                            GIF [id number] #[id number] was introduced through the                            mouth, and advanced to the second  part of duodenum.                            The upper GI endoscopy was accomplished without                            difficulty. The patient tolerated the procedure                            well. Scope In: Scope Out: Findings:                 The examined portions of the nasopharynx,                            oropharynx and larynx were normal.                           The examined esophagus was normal.                           The gastroesophageal flap valve was visualized                            endoscopically and classified as Hill Grade IV (no                            fold, wide open lumen, hiatal hernia present).                           A 4 cm hiatal hernia was present.                           Striped mildly erythematous mucosa was found in the                            gastric antrum. Biopsies were taken with a cold                            forceps for Helicobacter pylori testing. Estimated  blood loss was minimal.                           The exam of the stomach was otherwise normal.                           The examined duodenum was normal. Complications:            No immediate complications. Estimated Blood Loss:     Estimated blood loss was minimal. Impression:               - The examined portions of the nasopharynx,                            oropharynx and larynx were normal.                           - Normal esophagus. No evidence of reflux                            esophagitis or Barrett's esophagus                           - Gastroesophageal flap valve classified as Hill                            Grade IV (no fold, wide open lumen, hiatal hernia                            present).                           - 4 cm hiatal hernia.                           - Erythematous mucosa in the antrum. Biopsied.                           - Normal examined duodenum. Recommendation:           - Patient has a contact number available  for                            emergencies. The signs and symptoms of potential                            delayed complications were discussed with the                            patient. Return to normal activities tomorrow.                            Written discharge instructions were provided to the                            patient.                           -  Resume previous diet.                           - Continue present medications. Recommend trial of                            BID Protonix or adding 40 mg Pepacid at bedtime.                           - Await pathology results. Gladie Gravette E. Tomasa Rand, MD 01/20/2024 10:34:52 AM This report has been signed electronically.

## 2024-01-23 ENCOUNTER — Other Ambulatory Visit: Payer: Self-pay

## 2024-01-23 ENCOUNTER — Telehealth: Payer: Self-pay

## 2024-01-23 ENCOUNTER — Emergency Department (HOSPITAL_COMMUNITY)
Admission: EM | Admit: 2024-01-23 | Discharge: 2024-01-23 | Disposition: A | Discharge status: Home / Self Care | Attending: Emergency Medicine | Admitting: Emergency Medicine

## 2024-01-23 ENCOUNTER — Encounter (HOSPITAL_COMMUNITY): Payer: Self-pay

## 2024-01-23 DIAGNOSIS — K1379 Other lesions of oral mucosa: Secondary | ICD-10-CM | POA: Insufficient documentation

## 2024-01-23 MED ORDER — MAGIC MOUTHWASH
15.0000 mL | Freq: Once | ORAL | Status: AC
  Administered 2024-01-23: 15 mL via ORAL
  Filled 2024-01-23: qty 15

## 2024-01-23 MED ORDER — NYSTATIN 100000 UNIT/ML MT SUSP: 15.0000 mL | Freq: Four times a day (QID) | OROMUCOSAL | 0 refills | Status: DC | PRN

## 2024-01-23 MED ORDER — LIDOCAINE VISCOUS HCL 2 % MT SOLN: 15.0000 mL | OROMUCOSAL | 0 refills | Status: DC | PRN

## 2024-01-23 MED ORDER — LIDOCAINE VISCOUS HCL 2 % MT SOLN
15.0000 mL | Freq: Once | OROMUCOSAL | Status: AC
  Administered 2024-01-23: 15 mL via OROMUCOSAL
  Filled 2024-01-23: qty 15

## 2024-01-23 NOTE — ED Triage Notes (Addendum)
 Patient had endoscopy on Thursday. Stated he has a sore on the roof of his mouth. He thinks the sore got infected. Complaining of right upper tooth pain, right jaw pain. Pain move around the right side of his face.

## 2024-01-23 NOTE — Discharge Instructions (Addendum)
 I believe that the pain that you have in your mouth, is likely secondary to the procedure and got, you had a few days ago.  Use the mouthwash, as prescribed, it will help with the swelling, the pain.  Also take Tylenol and ibuprofen for your pain control.  Return to the ER if you have any fevers, chills, worsening pain.

## 2024-01-23 NOTE — Telephone Encounter (Signed)
  Follow up Call-     01/20/2024    9:18 AM  Call back number  Post procedure Call Back phone  # [phone]  Permission to leave phone message Yes     Patient questions:  Do you have a fever, pain , or abdominal swelling? No. Pain Score  0 *  Have you tolerated food without any problems? Yes.    Have you been able to return to your normal activities? Yes.    Do you have any questions about your discharge instructions: Diet   No. Medications  No. Follow up visit  No.  Do you have questions or concerns about your Care? Yes.    Patient expresses concerns about soreness all over his mouth. Says it started in his throat and it spread to the roof of his mouth and "all over". He is worried he has an infection and was thinking to go to urgent care since he feels it has gotten worse during over the weekend. He denies any fever. Patient advised to call back if he has any other questions or concerns or if anything else were to change.   Actions: * If pain score is 4 or above: Physician/ provider Notified : Scott E. Tomasa Rand, MD.

## 2024-01-23 NOTE — ED Provider Notes (Signed)
 [place] EMERGENCY DEPARTMENT AT [facility] Provider Note   CSN: [id number] Arrival date & time: 01/23/24  0820     History  Chief Complaint  Patient presents with   Facial Pain    John Chen is a 53 y.o. male, history of GERD, who presents to the ED secondary to pain in his mouth, since he had his endoscopy, on Thursday.  He states on Thursday, he had his endoscopy done, and he had pain right away afterwards.  He states he has roof of the mouth pain, as well as right-sided mouth pain.  States the pain is causing a headache, and denies any swelling of the throat, face.  Denies any redness, or fevers.  Has not taken anything for the pain, but is acutely concerned  Home Medications Prior to Admission medications   Medication Sig Start Date End Date Taking? Authorizing Provider  magic mouthwash (nystatin, hydrocortisone, diphenhydrAMINE, lidocaine) suspension Swish and swallow 15 mLs 4 (four) times daily as needed for mouth pain. 01/23/24  Yes Diondra Pines L, PA  amLODipine (NORVASC) 10 MG tablet Take 1 tablet (10 mg total) by mouth daily. 06/28/23   Mliss Sax, MD  pantoprazole (PROTONIX) 40 MG tablet Take 1 tablet (40 mg total) by mouth 2 (two) times daily. 01/20/24   Jenel Lucks, MD      Allergies    Amoxicillin    Review of Systems   Review of Systems  Constitutional:  Negative for fever.  Skin:  Positive for wound.    Physical Exam Updated Vital Signs BP (!) 148/97   Pulse 79   Temp 98.8 F (37.1 C) (Oral)   Resp 16   Ht 5\' 9"  (1.753 m)   Wt 88.5 kg   SpO2 95%   BMI 28.80 kg/m  Physical Exam Vitals and nursing note reviewed.  Constitutional:      General: He is not in acute distress.    Appearance: He is well-developed.  HENT:     Head: Normocephalic and atraumatic.     Right Ear: Tympanic membrane normal.     Left Ear: Tympanic membrane normal.     Mouth/Throat:     Mouth: Mucous membranes are moist.     Comments: Skin  breakdown, on roof of mouth, as well as the right buccal oral mucosa.  No evidence of any kind of erythema, edema, or warmth. No facial erythema, edema or warmth.  Eyes:     General:        Right eye: No discharge.        Left eye: No discharge.     Conjunctiva/sclera: Conjunctivae normal.  Pulmonary:     Effort: No respiratory distress.  Neurological:     Mental Status: He is alert.     Comments: Clear speech.   Psychiatric:        Behavior: Behavior normal.        Thought Content: Thought content normal.     ED Results / Procedures / Treatments   Labs (all labs ordered are listed, but only abnormal results are displayed) Labs Reviewed - No data to display  EKG None  Radiology No results found.  Procedures Procedures    Medications Ordered in ED Medications  magic mouthwash (has no administration in time range)  lidocaine (XYLOCAINE) 2 % viscous mouth solution 15 mL (has no administration in time range)    ED Course/ Medical Decision Making/ A&P  Medical Decision Making Patient is a 53 year old male, here for mouth pain, this been going on for the last 2 days after having it is and does not diascopy, on 3/28, he has skin breakdown, to the roof of his mouth, as well as the right buccal oral mucosa, it appears like the area was irritated, when the endoscopy occurred.  Overall no signs of is infection at this time, no erythema, edema, or warmth, of the mouth, or the face.  He is able to swallow, eat appropriately.  We will start him on Magic mouthwash, to help with the pain, including hydrocortisone, as lidocaine to help alleviate the pain, and Benadryl.  He was instructed on return precautions and discharged home.  Risk Prescription drug management.   Final Clinical Impression(s) / ED Diagnoses Final diagnoses:  Mouth pain    Rx / DC Orders ED Discharge Orders          Ordered    magic mouthwash (nystatin, hydrocortisone,  diphenhydrAMINE, lidocaine) suspension  4 times daily PRN        01/23/24 0836              Okechukwu Regnier, Harley Alto, PA 01/23/24 1610    Gloris Manchester, MD 01/23/24 [id number]

## 2024-01-23 NOTE — Telephone Encounter (Signed)
 Spoke with patient.  He had just left the ED and was evaluated there.  They gave him magic mouthwash for pain.  He will call back as needed.

## 2024-01-23 NOTE — Telephone Encounter (Signed)
 Though it would be unusual to have a problem I agree with urgent care evaluation so they can make sure there has not been some sort of irritation or inflammation of the mouth and throat.

## 2024-01-24 LAB — SURGICAL PATHOLOGY

## 2024-01-25 ENCOUNTER — Telehealth: Payer: Self-pay

## 2024-01-25 NOTE — Transitions of Care (Post Inpatient/ED Visit) (Signed)
   01/25/2024  Name: John Chen MRN: [id number] DOB: [birth year]  Today's TOC FU Call Status: Today's TOC FU Call Status:: Successful TOC FU Call Completed TOC FU Call Complete Date: 01/25/24 Patient's Name and Date of Birth confirmed.  Transition Care Management Follow-up Telephone Call Date of Discharge: 01/23/24 Discharge Facility: [facility]) Type of Discharge: Emergency Department Reason for ED Visit: Other: (facial pain) How have you been since you were released from the hospital?: Better Any questions or concerns?: No  Items Reviewed: Did you receive and understand the discharge instructions provided?: Yes Medications obtained,verified, and reconciled?: Yes (Medications Reviewed) Any new allergies since your discharge?: No Dietary orders reviewed?: NA Do you have support at home?: No  Medications Reviewed Today: Medications Reviewed Today     Reviewed by Leroy Kennedy, CMA (Certified Medical Assistant) on 01/25/24 at 1013  Med List Status: <None>   Medication Order Taking? Sig Documenting Provider Last Dose Status Informant  0.9 %  sodium chloride infusion [id number]   Jenel Lucks, MD  Active   amLODipine (NORVASC) 10 MG tablet [id number] Yes Take 1 tablet (10 mg total) by mouth daily. Mliss Sax, MD Taking Active   lidocaine (XYLOCAINE) 2 % solution [id number] Yes Use as directed 15 mLs in the mouth or throat as needed for mouth pain. Jacalyn Lefevre, MD Taking Active   magic mouthwash (nystatin, hydrocortisone, diphenhydrAMINE, lidocaine) suspension [id number] Yes Swish and swallow 15 mLs 4 (four) times daily as needed for mouth pain. Small, Brooke L, PA Taking Active   pantoprazole (PROTONIX) 40 MG tablet [id number] Yes Take 1 tablet (40 mg total) by mouth 2 (two) times daily. Jenel Lucks, MD Taking Active             Home Care and Equipment/Supplies: Were Home Health Services Ordered?: NA Any new equipment or medical supplies  ordered?: NA  Functional Questionnaire: Do you need assistance with bathing/showering or dressing?: No Do you need assistance with meal preparation?: No Do you need assistance with eating?: No Do you have difficulty maintaining continence: No Do you need assistance with getting out of bed/getting out of a chair/moving?: No Do you have difficulty managing or taking your medications?: No  Follow up appointments reviewed: PCP Follow-up appointment confirmed?: Yes Date of PCP follow-up appointment?: 02/14/24 Follow-up Provider: Dr. Doreene [facility] Follow-up appointment confirmed?: NA Do you need transportation to your follow-up appointment?: No Do you understand care options if your condition(s) worsen?: Yes-patient verbalized understanding    SIGNATURE Jodelle Green, RMA

## 2024-01-30 ENCOUNTER — Encounter: Payer: Self-pay | Admitting: Gastroenterology

## 2024-01-30 NOTE — Progress Notes (Signed)
 John Chen,  The biopsies taken from your stomach were notable for mild reactive gastropathy which is a common finding and often related to use of certain medications (usually NSAIDs), but there was no evidence of Helicobacter pylori infection. This common finding is not felt to necessarily be a cause of any particular symptom and there is no specific treatment or further evaluation recommended.  Please take the pantoprazole twice daily, follow the dietary recommendations discussed and follow up with me in the office as needed for ongoing management of your chronic GERD symptoms.

## 2024-02-07 ENCOUNTER — Ambulatory Visit: Payer: PRIVATE HEALTH INSURANCE | Admitting: Gastroenterology

## 2024-02-07 ENCOUNTER — Encounter: Payer: Self-pay | Admitting: Gastroenterology

## 2024-02-07 VITALS — BP 118/76 | HR 56 | Ht 69.0 in | Wt 202.0 lb

## 2024-02-07 DIAGNOSIS — K219 Gastro-esophageal reflux disease without esophagitis: Secondary | ICD-10-CM | POA: Diagnosis not present

## 2024-02-07 DIAGNOSIS — K5900 Constipation, unspecified: Secondary | ICD-10-CM

## 2024-02-07 DIAGNOSIS — R1032 Left lower quadrant pain: Secondary | ICD-10-CM

## 2024-02-07 DIAGNOSIS — Z1211 Encounter for screening for malignant neoplasm of colon: Secondary | ICD-10-CM

## 2024-02-07 MED ORDER — FAMOTIDINE 40 MG PO TABS: 40.0000 mg | ORAL_TABLET | Freq: Every day | ORAL | 3 refills | Status: DC

## 2024-02-07 NOTE — Patient Instructions (Signed)
 We have sent the following medications to your pharmacy for you to pick up at your convenience: Famotidine 40mg  at bedtime  _______________________________________________________  If your blood pressure at your visit was 140/90 or greater, please contact your primary care physician to follow up on this.  _______________________________________________________  If you are age 54 or older, your body mass index should be between 23-30. Your Body mass index is 29.83 kg/m. If this is out of the aforementioned range listed, please consider follow up with your Primary Care Provider.  If you are age 8 or younger, your body mass index should be between 19-25. Your Body mass index is 29.83 kg/m. If this is out of the aformentioned range listed, please consider follow up with your Primary Care Provider.   ________________________________________________________  The [facility] providers would like to encourage you to use MYCHART to communicate with providers for non-urgent requests or questions.  Due to long hold times on the telephone, sending your provider a message by [facility] may be a faster and more efficient way to get a response.  Please allow 48 business hours for a response.  Please remember that this is for non-urgent requests.  _______________________________________________________ Thank you for trusting me with your gastrointestinal care!   Suzanna Erp, PA

## 2024-02-07 NOTE — Progress Notes (Signed)
 Chief Complaint: Follow-up Primary GI MD: Dr. Tomasa Rand  HPI: 53 year old male with medical history as listed below presents for follow-up.  Seen 12/13/2023 with LLQ pain and constipation.  Recommended to get on fiber supplement and use IBgard.  He also is having chronic GERD symptoms worsening over the past few months with continued symptoms on Prilosec 20 Mg.  Changed to pantoprazole 40 Mg once daily and underwent EGD.  EGD showed normal esophagus, gastroesophageal valve classified as Hill grade IV, 4 cm hiatal hernia, erythematous mucosa in antrum, normal duodenum.  Biopsies H. pylori negative.  Patient was recommended to increase pantoprazole to twice daily.  If you days after his endoscopy he began having right sided mouth pain and pain on the roof of his mouth and sought evaluation in the ED.  Was noted to have skin breakdown of the roof of his mouth and in the right buccal oral mucosa.  He was given Magic mouthwash upon discharge  ------------------TODAY--------------------------------  Patient states he continues to have breakthrough symptoms despite PPI twice daily.  States he notes breakthrough symptoms a few times per day.  In regards to his LLQ pain and constipation he has noticed some improvement on fiber.  He has not taken IBgard.  He stopped taking his weight gain/protein powder and had a resolution of his pain and then when he restarted it this morning he noted his pain came back and it is similar to a gas pain that is relieved with expressing gas.  His bowel movements have improved.   PREVIOUS GI WORKUP   EGD 01/20/2024 for GERD - The examined portions of the nasopharynx, oropharynx and larynx were normal.  - Normal esophagus. No evidence of reflux esophagitis or Barrett' s esophagus  - Gastroesophageal flap valve classified as Hill Grade IV ( no fold, wide open lumen, hiatal hernia present) .  - 4 cm hiatal hernia.  - Erythematous mucosa in the antrum. Biopsied.  -  Normal examined duodenum.  He underwent a colonoscopy in 2016, which showed no polyps. He is not due for another colonoscopy until 2026.   Past Medical History:  Diagnosis Date   GERD (gastroesophageal reflux disease)    Hypertension     Past Surgical History:  Procedure Laterality Date   NO PAST SURGERIES      Current Outpatient Medications  Medication Sig Dispense Refill   amLODipine (NORVASC) 10 MG tablet Take 1 tablet (10 mg total) by mouth daily. 90 tablet 3   famotidine (PEPCID) 40 MG tablet Take 1 tablet (40 mg total) by mouth at bedtime. 30 tablet 3   pantoprazole (PROTONIX) 40 MG tablet Take 1 tablet (40 mg total) by mouth 2 (two) times daily. 180 tablet 3   Current Facility-Administered Medications  Medication Dose Route Frequency Provider Last Rate Last Admin   0.9 %  sodium chloride infusion  500 mL Intravenous Once Jenel Lucks, MD        Allergies as of 02/07/2024 - Review Complete 02/07/2024  Allergen Reaction Noted   Amoxicillin Rash 03/07/2023    Family History  Problem Relation Age of Onset   Hypertension Mother    Hypertension Father    Asthma Sister    Hypertension Sister    Asthma Sister    Drug abuse Brother    Early death Brother    Heart attack Brother    Hypertension Maternal Grandmother    Alcohol abuse Neg Hx    Cancer Neg Hx    Diabetes Neg Hx  Hearing loss Neg Hx    Heart disease Neg Hx    Hyperlipidemia Neg Hx    Kidney disease Neg Hx    Stroke Neg Hx    Colon cancer Neg Hx    Stomach cancer Neg Hx    Esophageal cancer Neg Hx    Colon polyps Neg Hx     Social History   Socioeconomic History   Marital status: Married    Spouse name: Not on file   Number of children: 5   Years of education: Not on file   Highest education level: GED or equivalent  Occupational History   Not on file  Tobacco Use   Smoking status: Never   Smokeless tobacco: Never  Vaping Use   Vaping status: Never Used  Substance and Sexual  Activity   Alcohol use: Yes    Alcohol/week: 1.0 standard drink of alcohol    Types: 1 Cans of beer per week    Comment: daily after work, craft beer   Drug use: No   Sexual activity: Yes  Other Topics Concern   Not on file  Social History Narrative   Not on file   Social Drivers of Health   Financial Resource Strain: Low Risk  (06/24/2023)   Overall Financial Resource Strain (CARDIA)    Difficulty of Paying Living Expenses: Not very hard  Food Insecurity: No Food Insecurity (06/24/2023)   Hunger Vital Sign    Worried About Running Out of Food in the Last Year: Never true    Ran Out of Food in the Last Year: Never true  Transportation Needs: No Transportation Needs (06/24/2023)   PRAPARE - Administrator, Civil Service (Medical): No    Lack of Transportation (Non-Medical): No  Physical Activity: Sufficiently Active (06/24/2023)   Exercise Vital Sign    Days of Exercise per Week: 2 days    Minutes of Exercise per Session: 130 min  Stress: No Stress Concern Present (06/24/2023)   [employer] of Occupational Health - Occupational Stress Questionnaire    Feeling of Stress : Only a little  Social Connections: Moderately Integrated (06/24/2023)   Social Connection and Isolation Panel [NHANES]    Frequency of Communication with Friends and Family: More than three times a week    Frequency of Social Gatherings with Friends and Family: More than three times a week    Attends Religious Services: More than 4 times per year    Active Member of [employer] or Organizations: Yes    Attends Engineer, structural: More than 4 times per year    Marital Status: Separated  Intimate Partner Violence: Not on file    Review of Systems:    Constitutional: No weight loss, fever, chills, weakness or fatigue HEENT: Eyes: No change in vision               Ears, Nose, Throat:  No change in hearing or congestion Skin: No rash or itching Cardiovascular: No chest pain, chest pressure or  palpitations   Respiratory: No SOB or cough Gastrointestinal: See HPI and otherwise negative Genitourinary: No dysuria or change in urinary frequency Neurological: No headache, dizziness or syncope Musculoskeletal: No new muscle or joint pain Hematologic: No bleeding or bruising Psychiatric: No history of depression or anxiety    Physical Exam:  Vital signs: BP 118/76   Pulse (!) 56   Ht 5\' 9"  (1.753 m)   Wt 202 lb (91.6 kg)   BMI 29.83 kg/m  Constitutional: NAD, alert and cooperative Head:  Normocephalic and atraumatic. Eyes:   PEERL, EOMI. No icterus. Conjunctiva pink. Respiratory: Respirations even and unlabored. Lungs clear to auscultation bilaterally.   No wheezes, crackles, or rhonchi.  Cardiovascular:  Regular rate and rhythm. No peripheral edema, cyanosis or pallor.  Gastrointestinal:  Soft, nondistended, nontender. No rebound or guarding. Normal bowel sounds. No appreciable masses or hepatomegaly. Rectal:  Declines Msk:  Symmetrical without gross deformities. Without edema, no deformity or joint abnormality.  Neurologic:  Alert and  oriented x4;  grossly normal neurologically.  Skin:   Dry and intact without significant lesions or rashes. Psychiatric: Oriented to person, place and time. Demonstrates good judgement and reason without abnormal affect or behaviors.   RELEVANT LABS AND IMAGING: CBC    Component Value Date/Time   WBC 5.6 08/19/2023 1413   RBC 4.94 08/19/2023 1413   HGB 15.0 08/19/2023 1413   HGB 14.0 01/07/2021 1658   HGB 12.9 (L) 03/21/2008 1048   HCT 45.3 08/19/2023 1413   HCT 40.6 01/07/2021 1658   HCT 38.1 (L) 03/21/2008 1048   PLT 215 08/19/2023 1413   PLT 200 01/07/2021 1658   MCV 91.7 08/19/2023 1413   MCV 90 01/07/2021 1658   MCV 91.7 03/21/2008 1048   MCH 30.4 08/19/2023 1413   MCHC 33.1 08/19/2023 1413   RDW 11.3 (L) 08/19/2023 1413   RDW 12.2 01/07/2021 1658   RDW 13.1 03/21/2008 1048   LYMPHSABS 1.6 01/11/2021 1136   LYMPHSABS  1.8 01/07/2021 1658   LYMPHSABS 1.4 03/21/2008 1048   MONOABS 0.5 01/11/2021 1136   MONOABS 0.5 03/21/2008 1048   EOSABS 0.1 01/11/2021 1136   EOSABS 0.1 01/07/2021 1658   BASOSABS 0.1 01/11/2021 1136   BASOSABS 0.1 01/07/2021 1658   BASOSABS 0.0 03/21/2008 1048    CMP     Component Value Date/Time   NA 136 08/19/2023 1413   NA 142 03/11/2023 1520   K 4.0 08/19/2023 1413   CL 101 08/19/2023 1413   CO2 26 08/19/2023 1413   GLUCOSE 176 (H) 08/19/2023 1413   BUN 19 08/19/2023 1413   BUN 16 03/11/2023 1520   CREATININE 1.23 08/19/2023 1413   CREATININE 1.20 07/10/2021 1633   CALCIUM 9.5 08/19/2023 1413   PROT 7.7 08/19/2023 1413   PROT 7.6 01/07/2021 1658   ALBUMIN 4.3 08/19/2023 1413   ALBUMIN 5.1 (H) 01/07/2021 1658   AST 35 08/19/2023 1413   ALT 41 08/19/2023 1413   ALKPHOS 45 08/19/2023 1413   BILITOT 0.9 08/19/2023 1413   BILITOT 0.5 01/07/2021 1658   GFRNONAA >60 08/19/2023 1413     Assessment/Plan:   GERD EGD March 2025 with 4 cm hiatal hernia, no Barrett's/esophagitis, erythematous mucosa in antrum (H. pylori negative).  Increased to PPI twice daily and still having breakthrough symptoms on a daily basis. - Add on famotidine 40 Mg once daily - Consider switching PPI if continued breakthrough symptoms - Educated patient on lifestyle modifications and provided patient education handouts - Follow-up 8 to 12 weeks (MyChart message sooner if worsening symptoms)  Mouth pain Occurred after endoscopy and evaluated in ED and was discharged with Magic mouthwash.  This resolved.  LLQ pain Constipation Recommended fiber and IBgard.  Bowel movements improved and he has associated his LLQ pain with a certain protein powder as his pain resolves when he is not taking it. - Continue to avoid protein powder that is bothersome - Continue fiber and IBgard - If constipation worsens add in  MiraLAX  Colon cancer screening Colonoscopy 2016.  Due for repeat 2026  Suzanna Erp, PA-C [facility] 02/07/2024, 10:52 AM  Cc: Tonna Frederic,*

## 2024-02-09 ENCOUNTER — Ambulatory Visit: Admitting: Family Medicine

## 2024-02-14 ENCOUNTER — Encounter: Payer: Self-pay | Admitting: Family Medicine

## 2024-02-14 ENCOUNTER — Ambulatory Visit (INDEPENDENT_AMBULATORY_CARE_PROVIDER_SITE_OTHER): Admitting: Family Medicine

## 2024-02-14 VITALS — BP 118/76 | HR 60 | Temp 97.5°F | Ht 69.0 in | Wt 200.6 lb

## 2024-02-14 DIAGNOSIS — I10 Essential (primary) hypertension: Secondary | ICD-10-CM | POA: Diagnosis not present

## 2024-02-14 DIAGNOSIS — Z1211 Encounter for screening for malignant neoplasm of colon: Secondary | ICD-10-CM | POA: Diagnosis not present

## 2024-02-14 DIAGNOSIS — R7989 Other specified abnormal findings of blood chemistry: Secondary | ICD-10-CM | POA: Diagnosis not present

## 2024-02-14 NOTE — Progress Notes (Signed)
 Established Patient Office Visit   Subjective:  Patient ID: John Chen, male    DOB: 1971/07/19  Age: 53 y.o. MRN: [id number]  Chief Complaint  Patient presents with   Hospitalization Follow-up    PT was in ER for mouth pain. Pt states improvement.     HPI Encounter Diagnoses  Name Primary?   Elevated LFTs Yes   Screening for colon cancer    Essential hypertension       Follow-up of above.  Past medical history of elevated liver enzymes that have normalized.  Workup has been negative.  Approximately a month ago he experienced pale stools that have since resolved.  He has 2-3 servings of alcohol 3-4 times weekly.  Currently seeing GI for GERD.  Recent upper GI pends past month revealed a gastric gastropathy.  His pantoprazole  was doubled to be taken twice daily.  Symptoms have improved.  He has ongoing follow-up with GI for this.  Blood pressure well-controlled with amlodipine .  He is due for his next colonoscopy.   Review of Systems  Constitutional: Negative.   HENT: Negative.    Eyes:  Negative for blurred vision, discharge and redness.  Respiratory: Negative.    Cardiovascular: Negative.   Gastrointestinal:  Negative for abdominal pain, blood in stool, constipation, heartburn, melena, nausea and vomiting.  Genitourinary: Negative.   Musculoskeletal: Negative.  Negative for myalgias.  Skin:  Negative for rash.  Neurological:  Negative for tingling, loss of consciousness and weakness.  Endo/Heme/Allergies:  Negative for polydipsia.     Current Outpatient Medications:    amLODipine  (NORVASC ) 10 MG tablet, Take 1 tablet (10 mg total) by mouth daily., Disp: 90 tablet, Rfl: 3   famotidine  (PEPCID ) 40 MG tablet, Take 1 tablet (40 mg total) by mouth at bedtime., Disp: 30 tablet, Rfl: 3   pantoprazole  (PROTONIX ) 40 MG tablet, Take 1 tablet (40 mg total) by mouth 2 (two) times daily., Disp: 180 tablet, Rfl: 3  Current Facility-Administered Medications:    0.9 %  sodium chloride   infusion, 500 mL, Intravenous, Once, Cunningham, Scott E, MD   Objective:     BP 118/76 (Cuff Size: Normal)   Pulse 60   Temp (!) 97.5 F (36.4 C) (Temporal)   Ht 5\' 9"  (1.753 m)   Wt 200 lb 9.6 oz (91 kg)   SpO2 96%   BMI 29.62 kg/m    Physical Exam Constitutional:      General: He is not in acute distress.    Appearance: Normal appearance. He is not ill-appearing, toxic-appearing or diaphoretic.  HENT:     Head: Normocephalic and atraumatic.     Right Ear: External ear normal.     Left Ear: External ear normal.     Mouth/Throat:     Mouth: Mucous membranes are moist.     Pharynx: Oropharynx is clear. No oropharyngeal exudate or posterior oropharyngeal erythema.  Eyes:     General: No scleral icterus.       Right eye: No discharge.        Left eye: No discharge.     Extraocular Movements: Extraocular movements intact.     Conjunctiva/sclera: Conjunctivae normal.     Pupils: Pupils are equal, round, and reactive to light.  Cardiovascular:     Rate and Rhythm: Normal rate and regular rhythm.  Pulmonary:     Effort: Pulmonary effort is normal. No respiratory distress.     Breath sounds: Normal breath sounds. No wheezing, rhonchi or rales.  Abdominal:  General: Bowel sounds are normal.     Tenderness: There is no abdominal tenderness. There is no guarding or rebound.  Musculoskeletal:     Cervical back: No rigidity or tenderness.  Skin:    General: Skin is warm and dry.  Neurological:     Mental Status: He is alert and oriented to person, place, and time.  Psychiatric:        Mood and Affect: Mood normal.        Behavior: Behavior normal.      No results found for any visits on 02/14/24.    The 10-year ASCVD risk score (Arnett DK, et al., 2019) is: 8.2%    Assessment & Plan:   Elevated LFTs -     Basic metabolic panel with GFR -     Hepatic function panel -     Phosphatidylethanol (PEth) -     Hepatitis C antibody -     Hepatitis B surface  antigen  Screening for colon cancer -     Ambulatory referral to Gastroenterology  Essential hypertension    Return in about 3 months (around 05/15/2024) for annual physical.    Tonna Frederic, MD

## 2024-02-15 LAB — BASIC METABOLIC PANEL WITH GFR
BUN: 19 mg/dL (ref 6–23)
CO2: 29 meq/L (ref 19–32)
Calcium: 9.8 mg/dL (ref 8.4–10.5)
Chloride: 101 meq/L (ref 96–112)
Creatinine, Ser: 1.26 mg/dL (ref 0.40–1.50)
GFR: 64.76 mL/min (ref >60.00)
Glucose, Bld: 83 mg/dL (ref 70–99)
Potassium: 3.9 meq/L (ref 3.5–5.1)
Sodium: 138 meq/L (ref 135–145)

## 2024-02-15 LAB — HEPATIC FUNCTION PANEL
ALT: 38 U/L (ref 0–53)
AST: 37 U/L (ref 0–37)
Albumin: 4.7 g/dL (ref 3.5–5.2)
Alkaline Phosphatase: 50 U/L (ref 39–117)
Bilirubin, Direct: 0.1 mg/dL (ref 0.0–0.3)
Total Bilirubin: 0.7 mg/dL (ref 0.2–1.2)
Total Protein: 7.3 g/dL (ref 6.0–8.3)

## 2024-02-15 LAB — HEPATITIS B SURFACE ANTIGEN: Hepatitis B Surface Ag: NONREACTIVE

## 2024-02-15 LAB — HEPATITIS C ANTIBODY: Hepatitis C Ab: NONREACTIVE

## 2024-02-16 ENCOUNTER — Encounter: Payer: Self-pay | Admitting: Family Medicine

## 2024-02-16 NOTE — Progress Notes (Signed)
 Agree with the assessment and plan as outlined by Suzanna Erp, PA-C.  If patient is having refractory GERD symptoms, he can also discuss hiatal hernia repair/fundoplication with surgeon.  If not interested in surgery, will need to continue try different acid suppressive therapies and focus on diet/lifestyle modifications.

## 2024-02-22 LAB — PHOSPHATIDYLETHANOL (PETH)
Phosphatidylethanol (PEth): 40 ng/mL
Phosphatidylethanol: POSITIVE — AB

## 2024-03-02 ENCOUNTER — Other Ambulatory Visit: Payer: Self-pay | Admitting: Gastroenterology

## 2024-03-26 ENCOUNTER — Encounter: Payer: Self-pay | Admitting: Internal Medicine

## 2024-04-06 ENCOUNTER — Other Ambulatory Visit: Payer: Self-pay | Admitting: Gastroenterology

## 2024-04-17 ENCOUNTER — Ambulatory Visit: Admitting: Gastroenterology

## 2024-04-17 ENCOUNTER — Encounter: Payer: Self-pay | Admitting: Gastroenterology

## 2024-04-17 VITALS — BP 124/86 | HR 63 | Ht 69.0 in | Wt 203.0 lb

## 2024-04-17 DIAGNOSIS — K5900 Constipation, unspecified: Secondary | ICD-10-CM

## 2024-04-17 DIAGNOSIS — K219 Gastro-esophageal reflux disease without esophagitis: Secondary | ICD-10-CM | POA: Diagnosis not present

## 2024-04-17 DIAGNOSIS — R1032 Left lower quadrant pain: Secondary | ICD-10-CM | POA: Diagnosis not present

## 2024-04-17 DIAGNOSIS — Z1211 Encounter for screening for malignant neoplasm of colon: Secondary | ICD-10-CM

## 2024-04-17 DIAGNOSIS — K449 Diaphragmatic hernia without obstruction or gangrene: Secondary | ICD-10-CM | POA: Diagnosis not present

## 2024-04-17 MED ORDER — VOQUEZNA 10 MG PO TABS: 1.0000 | ORAL_TABLET | Freq: Every day | ORAL | 5 refills | Status: DC

## 2024-04-17 NOTE — Progress Notes (Signed)
 Chief Complaint: GERD Primary GI MD: Dr. Stacia  HPI: Discussed the use of AI scribe software for clinical note transcription with the patient, who gave verbal consent to proceed.  History of Present Illness John Chen is a 53 year old male with gastroesophageal reflux disease (GERD) who presents with persistent nighttime symptoms despite medication.  He experiences persistent GERD symptoms at night despite taking pantoprazole  twice daily and famotidine  at night. His symptoms have improved during the day but remain bothersome at night. He takes pantoprazole  in the morning and at night, and famotidine  approximately an hour after his last meal.  He attempts to eat his last meal three hours before bedtime, although this is challenging due to his work schedule. He wants to be free of symptoms and medication. He has undergone an endoscopy in the past as part of his workup for GERD.  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   PREVIOUS GI WORKUP   EGD 01/20/2024 for GERD - The examined portions of the nasopharynx, oropharynx and larynx were normal.  - Normal esophagus. No evidence of reflux esophagitis or Barrett' s esophagus  - Gastroesophageal flap valve classified as Hill Grade IV ( no fold, wide open lumen, hiatal hernia present) .  - 4 cm hiatal hernia.  - Erythematous mucosa in the antrum. Biopsied.  - Normal examined duodenum.   He underwent a colonoscopy in 2016, which showed no polyps. He is not due for another colonoscopy until 2026.   Past Medical History:  Diagnosis Date   GERD (gastroesophageal reflux disease)    Hypertension     Past Surgical History:  Procedure Laterality Date   NO PAST SURGERIES      Current Outpatient Medications  Medication Sig Dispense Refill   amLODipine  (NORVASC ) 10 MG tablet Take 1 tablet (10 mg total) by mouth daily. 90 tablet 3   Vonoprazan Fumarate (VOQUEZNA) 10 MG TABS Take 1 tablet by  mouth daily. 30 tablet 5   Current Facility-Administered Medications  Medication Dose Route Frequency Provider Last Rate Last Admin   0.9 %  sodium chloride  infusion  500 mL Intravenous Once Stacia Glendia BRAVO, MD        Allergies as of 04/17/2024 - Review Complete 04/17/2024  Allergen Reaction Noted   Amoxicillin Rash 03/07/2023    Family History  Problem Relation Age of Onset   Hypertension Mother    Hypertension Father    Asthma Sister    Hypertension Sister    Asthma Sister    Drug abuse Brother    Early death Brother    Heart attack Brother    Hypertension Maternal Grandmother    Alcohol abuse Neg Hx    Cancer Neg Hx    Diabetes Neg Hx    Hearing loss Neg Hx    Heart disease Neg Hx    Hyperlipidemia Neg Hx    Kidney disease Neg Hx    Stroke Neg Hx    Colon cancer Neg Hx    Stomach cancer Neg Hx    Esophageal cancer Neg Hx    Colon polyps Neg Hx     Social History   Socioeconomic History   Marital status: Married    Spouse name: Not on file   Number of children: 5   Years of education: Not on file   Highest education level: GED or equivalent  Occupational History   Not on file  Tobacco Use  Smoking status: Never   Smokeless tobacco: Never  Vaping Use   Vaping status: Never Used  Substance and Sexual Activity   Alcohol use: Yes    Alcohol/week: 1.0 standard drink of alcohol    Types: 1 Cans of beer per week    Comment: daily after work, craft beer   Drug use: No   Sexual activity: Yes  Other Topics Concern   Not on file  Social History Narrative   Not on file   Social Drivers of Health   Financial Resource Strain: Low Risk  (02/10/2024)   Overall Financial Resource Strain (CARDIA)    Difficulty of Paying Living Expenses: Not hard at all  Food Insecurity: No Food Insecurity (02/10/2024)   Hunger Vital Sign    Worried About Running Out of Food in the Last Year: Never true    Ran Out of Food in the Last Year: Never true  Transportation Needs:  No Transportation Needs (02/10/2024)   PRAPARE - Administrator, Civil Service (Medical): No    Lack of Transportation (Non-Medical): No  Physical Activity: Sufficiently Active (02/10/2024)   Exercise Vital Sign    Days of Exercise per Week: 3 days    Minutes of Exercise per Session: 90 min  Stress: No Stress Concern Present (02/10/2024)   [employer] of Occupational Health - Occupational Stress Questionnaire    Feeling of Stress : Not at all  Social Connections: Moderately Integrated (02/10/2024)   Social Connection and Isolation Panel    Frequency of Communication with Friends and Family: More than three times a week    Frequency of Social Gatherings with Friends and Family: More than three times a week    Attends Religious Services: 1 to 4 times per year    Active Member of [employer] or Organizations: Yes    Attends Banker Meetings: 1 to 4 times per year    Marital Status: Divorced  Catering manager Violence: Not on file    Review of Systems:    Constitutional: No weight loss, fever, chills, weakness or fatigue HEENT: Eyes: No change in vision               Ears, Nose, Throat:  No change in hearing or congestion Skin: No rash or itching Cardiovascular: No chest pain, chest pressure or palpitations   Respiratory: No SOB or cough Gastrointestinal: See HPI and otherwise negative Genitourinary: No dysuria or change in urinary frequency Neurological: No headache, dizziness or syncope Musculoskeletal: No new muscle or joint pain Hematologic: No bleeding or bruising Psychiatric: No history of depression or anxiety    Physical Exam:  Vital signs: BP 124/86   Pulse 63   Ht 5' 9 (1.753 m)   Wt 203 lb (92.1 kg)   BMI 29.98 kg/m   Constitutional: NAD, alert and cooperative Head:  Normocephalic and atraumatic. Eyes:   PEERL, EOMI. No icterus. Conjunctiva pink. Respiratory: Respirations even and unlabored. Lungs clear to auscultation bilaterally.   No  wheezes, crackles, or rhonchi.  Cardiovascular:  Regular rate and rhythm. No peripheral edema, cyanosis or pallor.  Gastrointestinal:  Soft, nondistended, nontender. No rebound or guarding. Normal bowel sounds. No appreciable masses or hepatomegaly. Rectal:  Declines Msk:  Symmetrical without gross deformities. Without edema, no deformity or joint abnormality.  Neurologic:  Alert and  oriented x4;  grossly normal neurologically.  Skin:   Dry and intact without significant lesions or rashes. Psychiatric: Oriented to person, place and time. Demonstrates good judgement  and reason without abnormal affect or behaviors.  RELEVANT LABS AND IMAGING: CBC    Component Value Date/Time   WBC 5.6 08/19/2023 1413   RBC 4.94 08/19/2023 1413   HGB 15.0 08/19/2023 1413   HGB 14.0 01/07/2021 1658   HGB 12.9 (L) 03/21/2008 1048   HCT 45.3 08/19/2023 1413   HCT 40.6 01/07/2021 1658   HCT 38.1 (L) 03/21/2008 1048   PLT 215 08/19/2023 1413   PLT 200 01/07/2021 1658   MCV 91.7 08/19/2023 1413   MCV 90 01/07/2021 1658   MCV 91.7 03/21/2008 1048   MCH 30.4 08/19/2023 1413   MCHC 33.1 08/19/2023 1413   RDW 11.3 (L) 08/19/2023 1413   RDW 12.2 01/07/2021 1658   RDW 13.1 03/21/2008 1048   LYMPHSABS 1.6 01/11/2021 1136   LYMPHSABS 1.8 01/07/2021 1658   LYMPHSABS 1.4 03/21/2008 1048   MONOABS 0.5 01/11/2021 1136   MONOABS 0.5 03/21/2008 1048   EOSABS 0.1 01/11/2021 1136   EOSABS 0.1 01/07/2021 1658   BASOSABS 0.1 01/11/2021 1136   BASOSABS 0.1 01/07/2021 1658   BASOSABS 0.0 03/21/2008 1048    CMP     Component Value Date/Time   NA 138 02/14/2024 1629   NA 142 03/11/2023 1520   K 3.9 02/14/2024 1629   CL 101 02/14/2024 1629   CO2 29 02/14/2024 1629   GLUCOSE 83 02/14/2024 1629   BUN 19 02/14/2024 1629   BUN 16 03/11/2023 1520   CREATININE 1.26 02/14/2024 1629   CREATININE 1.20 07/10/2021 1633   CALCIUM 9.8 02/14/2024 1629   PROT 7.3 02/14/2024 1629   PROT 7.6 01/07/2021 1658   ALBUMIN 4.7  02/14/2024 1629   ALBUMIN 5.1 (H) 01/07/2021 1658   AST 37 02/14/2024 1629   ALT 38 02/14/2024 1629   ALKPHOS 50 02/14/2024 1629   BILITOT 0.7 02/14/2024 1629   BILITOT 0.5 01/07/2021 1658   GFRNONAA >60 08/19/2023 1413     Assessment/Plan:   GERD Hiatal hernia EGD March 2025 with 4 cm hiatal hernia, no Barrett's/esophagitis, erythematous mucosa in antrum (H. pylori negative).  Continued breakthrough symptoms (mainly at night) despite pantoprazole  40 Mg twice daily and famotidine  40 Mg once daily and adequate lifestyle modifications.   Patient's hiatal hernia likely playing a large role in his persistent symptoms.  Ideally he does not want to be on medication long-term and inquires about hiatal hernia surgery.  Discussed possibility he could be a candidate for TIF procedure with [facility] involvement for consideration of fundoplication  Patient is interested in surgical options for GERD - Trial of Voquezna 10 Mg once daily - Will discuss with Dr. San if patient is a TIF candidate. Pending his decision will refer to [facility] -- Continue with antireflux measures and lifestyle modifications, continue on PPI emphasizing timing prior to meals  LLQ pain Constipation Recommended fiber and IBgard.  Bowel movements improved and he has associated his LLQ pain with a certain protein powder as his pain resolves when he is not taking it. - Continue to avoid protein powder that is bothersome - Continue fiber and IBgard - If constipation worsens add in MiraLAX    Colon cancer screening Colonoscopy 2016.  Due for repeat 2026     Nestor Blower, PA-C [facility] 04/17/2024, 11:10 AM  Cc: Berneta Elsie Sim DEWAINE

## 2024-04-17 NOTE — Patient Instructions (Signed)
 Stop taking famotidine  and pantoprazole .   Start the samples of Voquezna 10 mg daily. I have sent a prescription to a mail order pharmacy called [facility]. They will reach out to you.   _______________________________________________________  If your blood pressure at your visit was 140/90 or greater, please contact your primary care physician to follow up on this.  _______________________________________________________  If you are age 54 or older, your body mass index should be between 23-30. Your Body mass index is 29.98 kg/m. If this is out of the aforementioned range listed, please consider follow up with your Primary Care Provider.  If you are age 41 or younger, your body mass index should be between 19-25. Your Body mass index is 29.98 kg/m. If this is out of the aformentioned range listed, please consider follow up with your Primary Care Provider.   ________________________________________________________  The [facility] providers would like to encourage you to use MYCHART to communicate with providers for non-urgent requests or questions.  Due to long hold times on the telephone, sending your provider a message by [facility] may be a faster and more efficient way to get a response.  Please allow 48 business hours for a response.  Please remember that this is for non-urgent requests.  _______________________________________________________

## 2024-04-25 ENCOUNTER — Other Ambulatory Visit: Payer: Self-pay

## 2024-04-25 DIAGNOSIS — K219 Gastro-esophageal reflux disease without esophagitis: Secondary | ICD-10-CM

## 2024-04-25 NOTE — Progress Notes (Signed)
 Order placed as requested. Rad scheduling to contact pt regarding appt. He was provided with their contact number if he does not hear something in a few days.

## 2024-04-30 ENCOUNTER — Other Ambulatory Visit: Payer: Self-pay | Admitting: *Deleted

## 2024-04-30 DIAGNOSIS — R9431 Abnormal electrocardiogram [ECG] [EKG]: Secondary | ICD-10-CM

## 2024-04-30 DIAGNOSIS — I10 Essential (primary) hypertension: Secondary | ICD-10-CM

## 2024-05-01 ENCOUNTER — Other Ambulatory Visit (HOSPITAL_COMMUNITY): Payer: PRIVATE HEALTH INSURANCE

## 2024-05-02 ENCOUNTER — Other Ambulatory Visit: Payer: Self-pay | Admitting: Gastroenterology

## 2024-05-03 ENCOUNTER — Ambulatory Visit (HOSPITAL_COMMUNITY)
Admission: RE | Admit: 2024-05-03 | Discharge: 2024-05-03 | Disposition: A | Discharge status: Home / Self Care | Source: Ambulatory Visit | Attending: Internal Medicine | Admitting: Internal Medicine

## 2024-05-03 DIAGNOSIS — R9431 Abnormal electrocardiogram [ECG] [EKG]: Secondary | ICD-10-CM | POA: Insufficient documentation

## 2024-05-03 LAB — ECHOCARDIOGRAM COMPLETE
AR max vel: 3.3 cm2
AV Peak grad: 4.8 mmHg
Ao pk vel: 1.09 m/s
Area-P 1/2: 3.34 cm2
S' Lateral: 3.2 cm

## 2024-05-07 ENCOUNTER — Ambulatory Visit: Payer: Self-pay | Admitting: Internal Medicine

## 2024-05-07 DIAGNOSIS — I517 Cardiomegaly: Secondary | ICD-10-CM

## 2024-05-09 NOTE — Progress Notes (Signed)
 Barium swallow study with tablet scheduled 05/18/2024.

## 2024-05-18 ENCOUNTER — Inpatient Hospital Stay (HOSPITAL_COMMUNITY): Admission: RE | Admit: 2024-05-18 | Source: Ambulatory Visit

## 2024-05-18 ENCOUNTER — Encounter (HOSPITAL_COMMUNITY): Payer: Self-pay

## 2024-06-01 ENCOUNTER — Ambulatory Visit (HOSPITAL_COMMUNITY)
Admission: RE | Admit: 2024-06-01 | Discharge: 2024-06-01 | Disposition: A | Discharge status: Home / Self Care | Source: Ambulatory Visit | Attending: Gastroenterology | Admitting: Gastroenterology

## 2024-06-01 DIAGNOSIS — K219 Gastro-esophageal reflux disease without esophagitis: Secondary | ICD-10-CM | POA: Diagnosis present

## 2024-06-05 ENCOUNTER — Ambulatory Visit: Payer: Self-pay | Admitting: Gastroenterology

## 2024-06-05 NOTE — Progress Notes (Signed)
 Mr. John Chen,  Your barium swallow did not show any obvious problems with the motility (squeezing/relaxing) of the esophagus.   No reflux was seen during the exam, and the hiatal hernia was not well appreciated on the study.  If you are still having bothersome GERD symptoms and wish to continue with pursuing fundoplication, we will need to do some additional testing to confirm that your symptoms are indeed due to excessive reflux.  This can be done with a test called pH/impedance testing which involves passing a small catheter through the nose into the esophagus.  This catheter detects reflux events for 24 events.  This is an unsedated procedure.  An alternative test is called a Bravo probe study, which involves repeating an upper endoscopy (like what you had in March), but a small sensor is attached to your esophagus which detects acid reflux events for 48 hours.  If these studies prove that you are having excessive reflux and that your symptoms correlate with acid reflux, then you may benefit from fundoplication.  Would you be interested in pursuing one of these next studies or like to discuss this further?

## 2024-07-18 ENCOUNTER — Encounter (HOSPITAL_COMMUNITY): Payer: Self-pay | Admitting: Internal Medicine

## 2024-07-20 ENCOUNTER — Ambulatory Visit (INDEPENDENT_AMBULATORY_CARE_PROVIDER_SITE_OTHER): Admitting: Otolaryngology

## 2024-07-20 VITALS — BP 143/89 | HR 56 | Temp 98.0°F | Ht 69.0 in | Wt 190.0 lb

## 2024-07-20 DIAGNOSIS — R0981 Nasal congestion: Secondary | ICD-10-CM

## 2024-07-20 DIAGNOSIS — R0982 Postnasal drip: Secondary | ICD-10-CM

## 2024-07-20 DIAGNOSIS — R09A2 Foreign body sensation, throat: Secondary | ICD-10-CM

## 2024-07-20 DIAGNOSIS — H9319 Tinnitus, unspecified ear: Secondary | ICD-10-CM

## 2024-07-20 DIAGNOSIS — H9313 Tinnitus, bilateral: Secondary | ICD-10-CM

## 2024-07-20 DIAGNOSIS — H903 Sensorineural hearing loss, bilateral: Secondary | ICD-10-CM | POA: Diagnosis not present

## 2024-07-20 DIAGNOSIS — K219 Gastro-esophageal reflux disease without esophagitis: Secondary | ICD-10-CM | POA: Diagnosis not present

## 2024-07-20 DIAGNOSIS — J3089 Other allergic rhinitis: Secondary | ICD-10-CM

## 2024-07-20 NOTE — Patient Instructions (Signed)

## 2024-07-20 NOTE — Progress Notes (Signed)
 ENT Progress Note:   Update 07/20/2024  Discussed the use of AI scribe software for clinical note transcription with the patient, who gave verbal consent to proceed.  History of Present Illness John Chen is a 53 year old male who presents with tinnitus and globus sensation.  He has been experiencing a persistent 'thumping' sensation in his ear for the past two to three weeks. The sound is continuous and began suddenly. He has a history of mild hearing loss, identified during a hearing test approximately six months ago, but does not report any significant changes in his hearing ability since then. The noise is more noticeable in quiet environments, such as at night. No dramatic changes in hearing are noted.  He also experiences a sensation of something being 'stuck' in his throat, which persists despite swallowing or drinking water. He experiences a sensation of something being 'stuck' in his throat, which persists despite swallowing or drinking water. The sensation still occurs occasionally, particularly after eating late at night. He identifies as a 'big eater' and notes difficulty in changing his eating habits. He has previously tried Reflux Gourmet without success and has been taking pantoprazole  for management of his reflux symptoms.   Records Reviewed:  Initial Evaluation  Update last OV  Discussed the use of AI scribe software for clinical note transcription with the patient, who gave verbal consent to proceed.  History of Present Illness   John Chen is a 53 year old male who presents with hearing test result review and ongoing acid reflux symptoms/cough.  He underwent a hearing test which revealed very mild high-frequency hearing loss, symmetric and bilateral only affecting high-freq range sounds. Middle ear function was good, Type A tymps.   He experiences ongoing acid reflux symptoms. Pepcid  was prescribed but did not alleviate his symptoms, and another medication also  failed to provide relief. He does not consume soda, and he typically eats dinner around 8 PM, going to bed around 11 or 11:30 PM. He is scheduled with GI for EGD.   He has a dry cough, which he believes may be related to reflux and postnasal drainage. He previously used Flonase  and Zyrtec  for about one and a half to two months, but did not find them helpful and discontinued.     Records Reviewed:  Office visit with GI 12/13/23 Last seen in 2023 by Dr. Stacia and at that time was having some lower abdominal pain with diarrhea.  Workup showed negative celiac and normal labs.  He was recommended to do a fecal calprotectin but this was not returned.  He was provided reassurance.  History of colonoscopy in 2016 which was normal. ----------------------TODAY-------------------------- He has been experiencing worsening acid reflux over the past few months. Despite taking over-the-counter Prilosec 20 mg, his symptoms persist. The reflux occurs frequently, especially at night, causing him to wake up with a burning sensation in his chest and sometimes leading to temporary voice loss. Dietary modifications, such as avoiding eating before bed and sleeping on his left side, provide some relief.   He experiences left lower abdominal pain almost daily, described as sharp and random, sometimes relieved by bowel movements. The pain occurs regardless of eating habits and can last from a few minutes to several hours. He associates the pain with constipation, experiencing hard stools and straining during bowel movements. He consumes protein shakes in the morning, which he believes may contribute to constipation. He avoids using the bathroom at work, leading to holding bowel movements until home, resulting  in urgency.   He underwent a colonoscopy in 2016, which showed no polyps. He is not due for another colonoscopy until 2026.   GERD Chronic symptoms, worsening over the past few months. Current treatment with Prilosec  20mg  OTC not effective. Nocturnal symptoms present.  Patient concerned about cancer.  No previous EGD.  Due to longstanding history of his GERD and no previous EGD I think it is reasonable for endoscopic evaluation to rule out Barrett's.  No NSAIDs - Pantoprazole  40 Mg once daily - EGD for further evaluation of esophagitis, gastritis, PUD -- Advise lifestyle modifications: avoid eating close to bedtime, consider wedge pillow for sleeping. -- Provided patient education handouts - I thoroughly discussed the procedure with the patient (at bedside) to include nature of the procedure, alternatives, benefits, and risks (including but not limited to bleeding, infection, perforation, anesthesia/cardiac pulmonary complications).  Patient verbalized understanding and gave verbal consent to proceed with procedure.     Initial Evaluation by me  Reason for Consult: sore throat/globus/burning sensation x 2 months and ringing in his ears  HPI: John Chen is an 53 y.o. male with hx cervical radiculopathy, reactive airway disease, here for evaluation of throat discomfort globus sensation and frequent burning in his throat for 2 months as well as ringing in his ears. He reports sore throat/raw sensation in his throat and it gets better intermittently, but gets worse again.  Symptoms present for x 2 months. He had reflux medications in the past. He eats late dinner. He had heartburn years ago, and his sx are different this time. He also has dry cough and lump sensation in his throat. He is on Nexium and it is helping. He tried to eat earlier x last 2 months, and symptoms are better no. If he eats foods that trigger reflux, his throat sx get worse.  He struggles to swallow food primarily solid foods when his symptoms are severe. He reports bilateral ringing in his ears for years. R ear feels clogged and he has R > L tinnitus has had it for years.  No ear pain, no ear drainage, no prior ear surgeries, no prior history  of recurrent ear infections.  No recent hearing evaluation.  Records Reviewed:  PCP office note by Dr Elsie Lent,   Sore thorat and burning x 1 month. Pt complains of burning in chest as well with headache a fatigue.    Presents with a 1 month history of sore throat in the back of his mouth. He denies headaches, fever or chills, postnasal drip, rhinorrhea, reflux, myalgias, nausea. There is been mild burning in his chest. What little cough he is experienced has been dry. There is been no wheezing or difficulty breathing. No asthma history. Recently separated and is under expected stress. No one is ever complained about snoring. Has not been sleeping as well after the separation. Mouth and nose are dry. No history of fall allergies.   Sore throat -     POCT rapid strep A -     Azithromycin ; Take 2 tablets on day 1, then 1 tablet daily on days 2 through 5  Dispense: 6 tablet; Refill: 0 -     Ambulatory referral to ENT -     DG Chest 2 View; Future   Essential hypertension -     amLODIPine  Besylate; Take 1 tablet (10 mg total) by mouth daily.  Dispense: 90 tablet; Refill: 3   Past Medical History:  Diagnosis Date   GERD (gastroesophageal reflux disease)  Hypertension     Past Surgical History:  Procedure Laterality Date   NO PAST SURGERIES      Family History  Problem Relation Age of Onset   Hypertension Mother    Hypertension Father    Asthma Sister    Hypertension Sister    Asthma Sister    Drug abuse Brother    Early death Brother    Heart attack Brother    Hypertension Maternal Grandmother    Alcohol abuse Neg Hx    Cancer Neg Hx    Diabetes Neg Hx    Hearing loss Neg Hx    Heart disease Neg Hx    Hyperlipidemia Neg Hx    Kidney disease Neg Hx    Stroke Neg Hx    Colon cancer Neg Hx    Stomach cancer Neg Hx    Esophageal cancer Neg Hx    Colon polyps Neg Hx     Social History:  reports that he has never smoked. He has never used smokeless tobacco. He  reports current alcohol use of about 1.0 standard drink of alcohol per week. He reports that he does not use drugs.  Allergies:  Allergies  Allergen Reactions   Amoxicillin Rash    Medications: I have reviewed the patient's current medications.  The PMH, PSH, Medications, Allergies, and SH were reviewed and updated.  ROS: Constitutional: Negative for fever, weight loss and weight gain. Cardiovascular: Negative for chest pain and dyspnea on exertion. Respiratory: Is not experiencing shortness of breath at rest. Gastrointestinal: Negative for nausea and vomiting. Neurological: Negative for headaches. Psychiatric: The patient is not nervous/anxious  Blood pressure (!) 143/89, pulse (!) 56, temperature 98 F (36.7 C), temperature source Oral, height 5' 9 (1.753 m), weight 190 lb (86.2 kg), SpO2 96%.  PHYSICAL EXAM:  Exam: General: Well-developed, well-nourished Respiratory Respiratory effort: Equal inspiration and expiration without stridor Cardiovascular Peripheral Vascular: Warm extremities with equal color/perfusion Eyes: No nystagmus with equal extraocular motion bilaterally Neuro/Psych/Balance: Patient oriented to person, place, and time; Appropriate mood and affect; Gait is intact with no imbalance; Cranial nerves I-XII are intact Head and Face Inspection: Normocephalic and atraumatic without mass or lesion Facial Strength: Facial motility symmetric and full bilaterally ENT Pinna: External ear intact and fully developed External canal: Canal is patent with intact skin Lips, Teeth, and gums: Mucosa and teeth intact and viable Neck Neck and Trachea: Midline trachea without mass or lesion Thyroid : No mass or nodularity Lymphatics: No lymphadenopathy  Studies Reviewed Esophagram 06/01/24 IMPRESSION: 1. The reported hiatal hernia is not well appreciated on today's exam, nor on the prior CT abdomen/pelvis of 08/19/2023. 2. Otherwise unremarkable esophagram, as  described.  Audiogram 11/25/23 Tympanometry: Right ear: Type A- Normal external ear canal volume with normal middle ear pressure and tympanic membrane compliance Left ear: Type A- Normal external ear canal volume with normal middle ear pressure and tympanic membrane compliance   Pure tone Audiometry: Normal hearing from [phone] Hz, then mild presumably sensorineural hearing loss at 8000 Hz, in both ears.   Of note, high frequency 12,000 Hz response in the right ear was at 55dBHL and in the left ear at 50dBHL    Assessment/Plan: Encounter Diagnoses  Name Primary?   Sensorineural hearing loss, bilateral Yes   Tinnitus of both ears    Globus sensation    Chronic nasal congestion    Chronic GERD    Post-nasal drip    Environmental and seasonal allergies      Chronic  throat discomfort globus sensation dry cough and intermittent dysphagia.  Flexible laryngoscopy with evidence of GERD LPR otherwise no concerning findings.  -Suspect untreated GERD LPR is the primary etiology for his symptoms -Screening esophagram to rule out esophageal pathology and risk factors for severe reflux -Trial of Pepcid  20 mg twice daily plus reflux Gourmet -Diet and lifestyle changes to minimize reflux -He is concerned about symptoms and would like to rule out esophageal cancer-due to severe symptoms of heartburn and GERD LPR, will refer to GI for upper endoscopy to rule out Barrett's  2.  Intermittent dry cough in the setting of history of reactive airway disease, evidence of nasal congestion and postnasal drainage on scope exam today.  Also reports sensation of clogged ears or ear fullness.  Suspect element of seasonal versus environmental allergies and eustachian tube dysfunction. - start Flonase  2 puffs bilateral nares twice daily and Zyrtec  10 mg daily  3.  Bilateral tinnitus for years-DDx includes eustachian tube dysfunction versus conductive versus sensorineural hearing loss - schedule  Audiogram  Return after testing  Update Last OV Assessment and Plan    Sensorineural Hearing Loss b/l symmetric mild Mild high-frequency hearing loss noted on recent audiogram. Middle ear function is normal. Likely age-related and possibly exacerbated by noise exposure. - Advise use of hearing protection during noise exposure (e.g., mowing grass/working outside) - No indication for hearing aids at this time  Chronic throat clearing and globus sensation dry cough - previously scoped without concerning findings  Dry cough likely due to a combination of GERD and postnasal drainage. Previous treatment with Flonase  and Zyrtec  was not effective after 1.5-2 months. Discussed the importance of continuing medication to potentially decrease cough duration and frequency. - Continue Flonase  and Zyrtec  if able  Chronic Gastroesophageal Reflux Disease (GERD) Persistent acid reflux despite treatment with Pepcid , changed to PPI by GI. He is scheduled for an upper endoscopy with GI to evaluate for esophageal and gastric pathology. Dietary modifications discussed. Discussed potential for switching reflux medication based on GI recommendations. - Continue current reflux medication - Proceed with GI evaluation and upper endoscopy - Advised dietary modifications to avoid reflux triggers (e.g., fried foods, spicy foods, high-fat content foods - Continue reflux gourmet   Follow-up - Follow up with GI for results of upper endoscopy - Return to clinic as needed.   Update 07/20/2024 Assessment and Plan Assessment & Plan Tinnitus associated with mild hearing loss noted on Audiogram 6  months ago Tinnitus for 2-3 weeks, thumping sound. Possible causes include mild hearing loss or stapedial spasm. Hypertension unlikely. - Repeat hearing test to assess changes was discussed but he would like to hold off - Advised use of white noise machines or apps for distraction. - Educated on benign nature and lack of specific  treatment.  Globus sensation secondary to gastroesophageal reflux disease (GERD) Persistent globus sensation likely due to GERD. Reflux symptoms improved but present. Reflux Gourmet ineffective.  - Continue pantoprazole  as previously prescribed. -  Reflux Gourmet after meals - diet and lifestyle changes to minimize GERD - Refer to [employer] blog for dietary and lifestyle modifications/reflux cook book       Elena Larry, MD Otolaryngology [facility] Phone: [phone] Fax: [phone]    07/20/2024, 11:23 AM

## 2024-08-03 ENCOUNTER — Ambulatory Visit: Admitting: Internal Medicine

## 2024-08-12 ENCOUNTER — Encounter: Payer: Self-pay | Admitting: Family Medicine

## 2024-08-13 ENCOUNTER — Other Ambulatory Visit: Payer: Self-pay

## 2024-08-13 DIAGNOSIS — I10 Essential (primary) hypertension: Secondary | ICD-10-CM

## 2024-08-13 MED ORDER — AMLODIPINE BESYLATE 10 MG PO TABS: 10.0000 mg | ORAL_TABLET | Freq: Every day | ORAL | 0 refills | Status: DC

## 2024-08-27 ENCOUNTER — Encounter (HOSPITAL_COMMUNITY): Payer: Self-pay

## 2024-08-27 DIAGNOSIS — I10 Essential (primary) hypertension: Secondary | ICD-10-CM

## 2024-08-27 DIAGNOSIS — I517 Cardiomegaly: Secondary | ICD-10-CM

## 2024-08-27 NOTE — Telephone Encounter (Signed)
 Called and spoke to pt. He will plan on having CBC drawn either today (08/27/24) or tomorrow in preparation for his Cardiac MRI; he will come to [facility] at [address]. Also, made f/u with Dr. Loni for Jan 2026. No other concerns

## 2024-08-28 ENCOUNTER — Ambulatory Visit: Payer: Self-pay | Admitting: Internal Medicine

## 2024-08-28 LAB — CBC
Hematocrit: 42.3 % (ref 37.5–51.0)
Hemoglobin: 13.8 g/dL (ref 13.0–17.7)
MCH: 29.8 pg (ref 26.6–33.0)
MCHC: 32.6 g/dL (ref 31.5–35.7)
MCV: 91 fL (ref 79–97)
Platelets: 249 x10E3/uL (ref 150–450)
RBC: 4.63 x10E6/uL (ref 4.14–5.80)
RDW: 12 % (ref 11.6–15.4)
WBC: 5.3 x10E3/uL (ref 3.4–10.8)

## 2024-08-29 ENCOUNTER — Other Ambulatory Visit: Payer: Self-pay | Admitting: Internal Medicine

## 2024-08-29 ENCOUNTER — Ambulatory Visit (HOSPITAL_COMMUNITY)
Admission: RE | Admit: 2024-08-29 | Discharge: 2024-08-29 | Disposition: A | Discharge status: Home / Self Care | Source: Ambulatory Visit | Attending: Internal Medicine | Admitting: Internal Medicine

## 2024-08-29 DIAGNOSIS — I517 Cardiomegaly: Secondary | ICD-10-CM | POA: Diagnosis present

## 2024-08-29 MED ORDER — GADOBUTROL 1 MMOL/ML IV SOLN
11.0000 mL | Freq: Once | INTRAVENOUS | Status: AC | PRN
  Administered 2024-08-29: 11 mL via INTRAVENOUS

## 2024-09-01 ENCOUNTER — Ambulatory Visit: Payer: Self-pay | Admitting: Internal Medicine

## 2024-09-11 ENCOUNTER — Other Ambulatory Visit: Payer: Self-pay | Admitting: Family Medicine

## 2024-09-11 DIAGNOSIS — I10 Essential (primary) hypertension: Secondary | ICD-10-CM

## 2024-10-12 ENCOUNTER — Other Ambulatory Visit: Payer: Self-pay | Admitting: Family Medicine

## 2024-10-12 DIAGNOSIS — I10 Essential (primary) hypertension: Secondary | ICD-10-CM

## 2024-10-16 ENCOUNTER — Encounter: Payer: Self-pay | Admitting: Family Medicine

## 2024-10-16 ENCOUNTER — Ambulatory Visit (INDEPENDENT_AMBULATORY_CARE_PROVIDER_SITE_OTHER): Admitting: Family Medicine

## 2024-10-16 VITALS — BP 122/76 | HR 55 | Temp 98.1°F | Ht 69.0 in | Wt 205.4 lb

## 2024-10-16 DIAGNOSIS — Z125 Encounter for screening for malignant neoplasm of prostate: Secondary | ICD-10-CM

## 2024-10-16 DIAGNOSIS — F1090 Alcohol use, unspecified, uncomplicated: Secondary | ICD-10-CM

## 2024-10-16 DIAGNOSIS — Z23 Encounter for immunization: Secondary | ICD-10-CM | POA: Diagnosis not present

## 2024-10-16 DIAGNOSIS — Z0000 Encounter for general adult medical examination without abnormal findings: Secondary | ICD-10-CM

## 2024-10-16 DIAGNOSIS — R09A2 Foreign body sensation, throat: Secondary | ICD-10-CM | POA: Diagnosis not present

## 2024-10-16 DIAGNOSIS — Z131 Encounter for screening for diabetes mellitus: Secondary | ICD-10-CM | POA: Diagnosis not present

## 2024-10-16 DIAGNOSIS — Z13220 Encounter for screening for lipoid disorders: Secondary | ICD-10-CM | POA: Diagnosis not present

## 2024-10-16 DIAGNOSIS — I10 Essential (primary) hypertension: Secondary | ICD-10-CM

## 2024-10-16 LAB — CBC WITH DIFFERENTIAL/PLATELET
Basophils Absolute: 0 K/uL (ref 0.0–0.1)
Basophils Relative: 1.1 % (ref 0.0–3.0)
Eosinophils Absolute: 0.1 K/uL (ref 0.0–0.7)
Eosinophils Relative: 2.6 % (ref 0.0–5.0)
HCT: 44.7 % (ref 39.0–52.0)
Hemoglobin: 14.9 g/dL (ref 13.0–17.0)
Lymphocytes Relative: 30.5 % (ref 12.0–46.0)
Lymphs Abs: 1.2 K/uL (ref 0.7–4.0)
MCHC: 33.3 g/dL (ref 30.0–36.0)
MCV: 90.3 fl (ref 78.0–100.0)
Monocytes Absolute: 0.5 K/uL (ref 0.1–1.0)
Monocytes Relative: 12.5 % — ABNORMAL HIGH (ref 3.0–12.0)
Neutro Abs: 2 K/uL (ref 1.4–7.7)
Neutrophils Relative %: 53.3 % (ref 43.0–77.0)
Platelets: 202 K/uL (ref 150.0–400.0)
RBC: 4.95 Mil/uL (ref 4.22–5.81)
RDW: 12.8 % (ref 11.5–15.5)
WBC: 3.8 K/uL — ABNORMAL LOW (ref 4.0–10.5)

## 2024-10-16 LAB — LIPID PANEL
Cholesterol: 134 mg/dL (ref 28–200)
HDL: 41.4 mg/dL
LDL Cholesterol: 61 mg/dL (ref 10–99)
NonHDL: 92.1
Total CHOL/HDL Ratio: 3
Triglycerides: 156 mg/dL — ABNORMAL HIGH (ref 10.0–149.0)
VLDL: 31.2 mg/dL (ref 0.0–40.0)

## 2024-10-16 LAB — COMPREHENSIVE METABOLIC PANEL WITH GFR
ALT: 36 U/L (ref 3–53)
AST: 33 U/L (ref 5–37)
Albumin: 4.5 g/dL (ref 3.5–5.2)
Alkaline Phosphatase: 56 U/L (ref 39–117)
BUN: 16 mg/dL (ref 6–23)
CO2: 31 meq/L (ref 19–32)
Calcium: 9.6 mg/dL (ref 8.4–10.5)
Chloride: 101 meq/L (ref 96–112)
Creatinine, Ser: 1.24 mg/dL (ref 0.40–1.50)
GFR: 65.7 mL/min
Glucose, Bld: 95 mg/dL (ref 70–99)
Potassium: 4.1 meq/L (ref 3.5–5.1)
Sodium: 139 meq/L (ref 135–145)
Total Bilirubin: 0.7 mg/dL (ref 0.2–1.2)
Total Protein: 7.4 g/dL (ref 6.0–8.3)

## 2024-10-16 LAB — URINALYSIS, ROUTINE W REFLEX MICROSCOPIC
Bilirubin Urine: NEGATIVE
Hgb urine dipstick: NEGATIVE
Ketones, ur: NEGATIVE
Leukocytes,Ua: NEGATIVE
Nitrite: NEGATIVE
RBC / HPF: NONE SEEN
Specific Gravity, Urine: 1.015 (ref 1.000–1.030)
Total Protein, Urine: NEGATIVE
Urine Glucose: NEGATIVE
Urobilinogen, UA: 0.2 (ref 0.0–1.0)
pH: 7.5 (ref 5.0–8.0)

## 2024-10-16 LAB — HEMOGLOBIN A1C: Hgb A1c MFr Bld: 4.7 % (ref 4.6–6.5)

## 2024-10-16 MED ORDER — AMLODIPINE BESYLATE 10 MG PO TABS: 10.0000 mg | ORAL_TABLET | Freq: Every day | ORAL | 2 refills | Status: AC

## 2024-10-16 NOTE — Progress Notes (Signed)
 "  Established Patient Office Visit   Subjective:  Patient ID: John Chen, male    DOB: 1971-01-30  Age: 53 y.o. MRN: [id number]  Chief Complaint  Patient presents with   Annual Exam    CPE/labs.  Fasting today,     HPI Encounter Diagnoses  Name Primary?   Healthcare maintenance Yes   Essential hypertension    Globus sensation    Immunization due    Screening for prostate cancer    Screening for cholesterol level    Screening for diabetes mellitus    Alcohol use    For physical and follow-up of above.  He is exercising for at least 250 minutes a week outside of work.  He has no regular dental care.  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  Consumes 2-3 beers nightly.  Awakens with a lump in his throat.  Urine flow is excellent but has nocturia x 1.  He does consume fluids just before bedtime.  No problems bowel movements.   Review of Systems  Constitutional: Negative.   HENT: Negative.    Eyes:  Negative for blurred vision, discharge and redness.  Respiratory: Negative.    Cardiovascular: Negative.   Gastrointestinal:  Negative for abdominal pain.  Genitourinary: Negative.   Musculoskeletal: Negative.  Negative for myalgias.  Skin:  Negative for rash.  Neurological:  Negative for tingling, loss of consciousness and weakness.  Endo/Heme/Allergies:  Negative for polydipsia.      10/16/2024    9:23 AM 07/30/2022    4:23 PM 06/10/2022    3:43 PM  Depression screen PHQ 2/9  Decreased Interest 0 0 0  Down, Depressed, Hopeless 0 0 0  PHQ - 2 Score 0 0 0     Current Medications[1]   Objective:     BP 122/76   Pulse (!) 55   Temp 98.1 F (36.7 C) (Temporal)   Ht 5' 9 (1.753 m)   Wt 205 lb 6.4 oz (93.2 kg)   SpO2 99%   BMI 30.33 kg/m    Physical Exam Constitutional:      General: He is not in acute distress.    Appearance: Normal appearance. He is not ill-appearing, toxic-appearing or diaphoretic.  HENT:     Head: Normocephalic and atraumatic.     Right  Ear: Tympanic membrane, ear canal and external ear normal.     Left Ear: Tympanic membrane, ear canal and external ear normal.     Mouth/Throat:     Mouth: Mucous membranes are moist.     Pharynx: Oropharynx is clear. No oropharyngeal exudate or posterior oropharyngeal erythema.  Eyes:     General: No scleral icterus.       Right eye: No discharge.        Left eye: No discharge.     Extraocular Movements: Extraocular movements intact.     Conjunctiva/sclera: Conjunctivae normal.     Pupils: Pupils are equal, round, and reactive to light.  Cardiovascular:     Rate and Rhythm: Normal rate and regular rhythm.  Pulmonary:     Effort: Pulmonary effort is normal. No respiratory distress.     Breath sounds: Normal breath sounds. No wheezing or rales.  Abdominal:     General: Bowel sounds are normal.     Tenderness: There is no abdominal tenderness. There is no guarding.     Hernia: A hernia is present. Hernia is present in the umbilical area. There is no hernia in the left inguinal area or right  inguinal area.  Genitourinary:    Penis: Circumcised. No hypospadias, erythema, tenderness, discharge, swelling or lesions.      Testes:        Right: Mass, tenderness or swelling not present. Right testis is descended.        Left: Mass, tenderness or swelling not present. Left testis is descended.     Epididymis:     Right: Not inflamed or enlarged.     Left: Not inflamed or enlarged.  Musculoskeletal:     Cervical back: No rigidity or tenderness.  Lymphadenopathy:     Cervical: No cervical adenopathy.     Lower Body: No right inguinal adenopathy. No left inguinal adenopathy.  Skin:    General: Skin is warm and dry.  Neurological:     Mental Status: He is alert and oriented to person, place, and time.  Psychiatric:        Mood and Affect: Mood normal.        Behavior: Behavior normal.      No results found for any visits on 10/16/24.    The 10-year ASCVD risk score (Arnett DK, et  al., 2019) is: 8.7%    Assessment & Plan:   Healthcare maintenance  Essential hypertension -     amLODIPine  Besylate; Take 1 tablet (10 mg total) by mouth daily.  Dispense: 90 tablet; Refill: 2 -     CBC with Differential/Platelet -     Comprehensive metabolic panel with GFR -     Urinalysis, Routine w reflex microscopic  Globus sensation -     Ambulatory referral to ENT  Immunization due -     Pneumococcal conjugate vaccine 20-valent -     Varicella-zoster vaccine IM  Screening for prostate cancer  Screening for cholesterol level -     Comprehensive metabolic panel with GFR -     Lipid panel  Screening for diabetes mellitus -     Comprehensive metabolic panel with GFR -     Hemoglobin A1c  Alcohol use    Return in about 6 months (around 04/16/2025), or if symptoms worsen or fail to improve.  Information on health maintenance and disease prevention was given.  Advise regular dental care.  Advised him to consume no more than 2 servings of alcohol or 12 ounce beers daily.  Pneumococcal 20 and his first Shingrix  today.  Information was given on these vaccines.  Second Shingrix  vaccine on follow-up in 6 months  Elsie Sim Lent, MD    [1]  Current Outpatient Medications:    amLODipine  (NORVASC ) 10 MG tablet, Take 1 tablet (10 mg total) by mouth daily., Disp: 90 tablet, Rfl: 2  Current Facility-Administered Medications:    0.9 %  sodium chloride  infusion, 500 mL, Intravenous, Once, Stacia Glendia BRAVO, MD  "

## 2024-10-19 ENCOUNTER — Ambulatory Visit: Payer: Self-pay | Admitting: Family Medicine

## 2024-10-19 ENCOUNTER — Encounter: Payer: Self-pay | Admitting: Family Medicine

## 2024-10-19 DIAGNOSIS — Z125 Encounter for screening for malignant neoplasm of prostate: Secondary | ICD-10-CM

## 2024-11-05 ENCOUNTER — Other Ambulatory Visit (INDEPENDENT_AMBULATORY_CARE_PROVIDER_SITE_OTHER)

## 2024-11-05 DIAGNOSIS — Z125 Encounter for screening for malignant neoplasm of prostate: Secondary | ICD-10-CM

## 2024-11-05 LAB — PSA: PSA: 0.37 ng/mL (ref 0.10–4.00)

## 2024-11-06 ENCOUNTER — Ambulatory Visit: Admitting: Internal Medicine

## 2024-11-06 ENCOUNTER — Ambulatory Visit: Payer: Self-pay | Admitting: Family Medicine

## 2024-11-06 ENCOUNTER — Encounter: Payer: Self-pay | Admitting: Internal Medicine

## 2024-11-06 VITALS — BP 144/74 | HR 61 | Ht 69.0 in | Wt 209.1 lb

## 2024-11-06 DIAGNOSIS — I517 Cardiomegaly: Secondary | ICD-10-CM | POA: Diagnosis not present

## 2024-11-06 DIAGNOSIS — R0681 Apnea, not elsewhere classified: Secondary | ICD-10-CM

## 2024-11-06 DIAGNOSIS — I10 Essential (primary) hypertension: Secondary | ICD-10-CM

## 2024-11-06 DIAGNOSIS — R9431 Abnormal electrocardiogram [ECG] [EKG]: Secondary | ICD-10-CM

## 2024-11-06 NOTE — Patient Instructions (Addendum)
 Medication Instructions:  No Changes  Lab Work: None  Testing/Procedures: WatchPAT?  Is a FDA cleared portable home sleep study test that uses a watch and 3 points of contact to monitor 7 different channels, including your heart rate, oxygen saturations, body position, snoring, and chest motion.  The study is easy to use from the comfort of your own home and accurately detect sleep apnea.  Before bed, you attach the chest sensor, attached the sleep apnea bracelet to your nondominant hand, and attach the finger probe.  After the study, the raw data is downloaded from the watch and scored for apnea events.   For more information: [email or web address]  Patient Testing Instructions:  Do not put battery into the device until bedtime when you are ready to begin the test. Please call the support number if you need assistance after following the instructions below: 24 hour support line- [phone] or [facility] support at [phone] (option 2)  Download the [facility] WatchPAT One app through the google play store or App Store  Be sure to turn on or enable access to bluetooth in settlings on your smartphone/ device  Make sure no other bluetooth devices are on and within the vicinity of your smartphone/ device and WatchPAT watch during testing.  Make sure to leave your smart phone/ device plugged in and charging all night.  When ready for bed:  Follow the instructions step by step in the WatchPAT One App to activate the testing device. For additional instructions, including video instruction, visit the WatchPAT One video on Youtube. You can search for WatchPat One within Youtube (video is 4 minutes and 18 seconds) or enter: https://youtube/watch?[email or web address]_vbiwxE Please note: You will be prompted to enter a Pin to connect via bluetooth when starting the test. The PIN will be assigned to you when you receive the test.  The device is disposable, but it recommended that you retain the device  until you receive a call letting you know the study has been received and the results have been interpreted.  We will let you know if the study did not transmit to us  properly after the test is completed. You do not need to call us  to confirm the receipt of the test.  Please complete the test within 48 hours of receiving PIN.   Frequently Asked Questions:  What is Watch Bruna one?  A single use fully disposable home sleep apnea testing device and will not need to be returned after completion.  What are the requirements to use WatchPAT one?  The be able to have a successful watchpat one sleep study, you should have your Watch pat one device, your smart phone, watch pat one app, your PIN number and Internet access What type of phone do I need?  You should have a smart phone that uses Android 5.1 and above or any Iphone with IOS 10 and above How can I download the WatchPAT one app?  Based on your device type search for WatchPAT one app either in google play for android devices or APP store for Iphone's Where will I get my PIN for the study?  Your PIN will be provided by your physician's office. It is used for authentication and if you lose/forget your PIN, please reach out to your providers office.  I do not have Internet at home. Can I do WatchPAT one study?  WatchPAT One needs Internet connection throughout the night to be able to transmit the sleep data. You can use your home/local internet or your cellular's data  package. However, it is always recommended to use home/local Internet. It is estimated that between 20MB-30MB will be used with each study.However, the application will be looking for space in the phone to start the study.  What happens if I lose internet or bluetooth connection?  During the internet disconnection, your phone will not be able to transmit the sleep data. All the data, will be stored in your phone. As soon as the internet connection is back on, the phone will being  sending the sleep data. During the bluetooth disconnection, WatchPAT one will not be able to to send the sleep data to your phone. Data will be kept in the WatchPAT one until two devices have bluetooth connection back on. As soon as the connection is back on, WatchPAT one will send the sleep data to the phone.  How long do I need to wear the WatchPAT one?  After you start the study, you should wear the device at least 6 hours.  How far should I keep my phone from the device?  During the night, your phone should be within 15 feet.  What happens if I leave the room for restroom or other reasons?  Leaving the room for any reason will not cause any problem. As soon as your get back to the room, both devices will reconnect and will continue to send the sleep data. Can I use my phone during the sleep study?  Yes, you can use your phone as usual during the study. But it is recommended to put your watchpat one on when you are ready to go to bed.  How will I get my study results?  A soon as you completed your study, your sleep data will be sent to the provider. They will then share the results with you when they are ready.     *-*-*-*-*-*-*-*-*-*-*-*-*-*-*-*-*-*-*-*-*-*-*-*-*-*-    You are scheduled for a Cardiac MRI at the location below on 08/28/2025 at 8:30 am (arrival time)  [facility] [address] [place], [place] [address] Please take advantage of the free valet parking available at the [facility] and [employer] (Entrance C).  Proceed to the [facility] Radiology Department (First Floor) for check-in.    Magnetic resonance imaging (MRI) is a painless test that produces images of the inside of the body without using Xrays.  During an MRI, strong magnets and radio waves work together in a data processing manager to form detailed images.   MRI images may provide more details about a medical condition than X-rays, CT scans, and ultrasounds can provide.  You may be given earphones to  listen for instructions.  You may eat a light breakfast and take medications as ordered with the exception of furosemide, hydrochlorothiazide, chlorthalidone  or spironolactone (or any other fluid pill). If you are undergoing a stress MRI, please avoid stimulants for 12 hr prior to test. (I.e. Caffeine, nicotine, chocolate, or antihistamine medications)  If your provider has ordered anti-anxiety medications for this test, then you will need a driver.  An IV will be inserted into one of your veins. Contrast material will be injected into your IV. It will leave your body through your urine within a day. You may be told to drink plenty of fluids to help flush the contrast material out of your system.  You will be asked to remove all metal, including: Watch, jewelry, and other metal objects including hearing aids, hair pieces and dentures. Also wearable glucose monitoring systems (ie. [employer] and [employer]) (  Braces and fillings normally are not a problem.)   TEST WILL TAKE APPROXIMATELY 1 HOUR  PLEASE NOTIFY SCHEDULING AT LEAST 24 HOURS IN ADVANCE IF YOU ARE UNABLE TO KEEP YOUR APPOINTMENT. [phone]  For more information and frequently asked questions, please visit our website : [email or web address]  Please call the Cardiac Imaging Nurse Navigators with any questions/concerns. [phone] Office    Follow-Up: At [facility], you and your health needs are our priority.  As part of our continuing mission to provide you with exceptional heart care, our providers are all part of one team.  This team includes your primary Cardiologist (physician) and Advanced Practice Providers or APPs (Physician Assistants and Nurse Practitioners) who all work together to provide you with the care you need, when you need it.  Your next appointment:    Due for November 2026 AFTER Cardiac MRI; we will mail a reminder letter around September; please call for appointment  Provider:    Gayatri A Acharya, MD

## 2024-11-06 NOTE — Progress Notes (Signed)
 " Cardiology Office Note:  .   Date:  11/06/2024  ID:  Lyle Faden, DOB 02/15/1971, MRN [id number] PCP: Berneta Elsie Sayre, MD  [facility] Providers Cardiologist:  Soyla DELENA Merck, MD    History of Present Illness: John Chen is a 54 y.o. male.  Discussed the use of AI scribe software for clinical note transcription with the patient, who gave verbal consent to proceed.  History of Present Illness Karas Schaible is a 54 year old male who presents for cardiovascular evaluation with HTN and borderline RV systolic function with unclear etiology.   He denies shortness of breath. He exercises and has no symptoms.   He has symptoms concerning for sleep apnea, including waking with a dry throat, rare snoring, and feeling like he stops breathing when lying on his back.  He reports ongoing acid reflux that he feels affects his throat but is not taking heartburn medication. Globus sensation under review with ENT.  He takes amlodipine  10 mg daily for blood pressure. Home readings average about 130/80 mmHg, though his blood pressure was slightly elevated in clinic today. He can track his blood pressure electronically but does not keep a written log.    ROS: negative except per HPI above.  Studies Reviewed: SABRA   EKG Interpretation Date/Time:  Tuesday November 06 2024 11:14:14 EST Ventricular Rate:  61 PR Interval:  148 QRS Duration:  86 QT Interval:  394 QTC Calculation: 396 R Axis:   35  Text Interpretation: Normal sinus rhythm Poor R wave progression grossly unchanged from prior Confirmed by Soffia Doshier ([id number]) on 11/06/2024 11:40:58 AM    Results Labs Cholesterol: Within normal limits  Radiology Cardiac MRI (08/2024): Normal cardiac chamber size; borderline right ventricular systolic function (Independently interpreted)  Diagnostic EKG (11/06/2024): Within normal limits (Independently interpreted) Risk Assessment/Calculations:       Physical Exam:    VS:  BP (!) 144/74 (BP Location: Left Arm, Patient Position: Sitting)   Pulse 61   Ht 5' 9 (1.753 m)   Wt 209 lb 1.6 oz (94.8 kg)   SpO2 97%   BMI 30.88 kg/m    Wt Readings from Last 3 Encounters:  11/06/24 209 lb 1.6 oz (94.8 kg)  10/16/24 205 lb 6.4 oz (93.2 kg)  07/20/24 190 lb (86.2 kg)     Physical Exam GENERAL: Alert, cooperative, well developed, no acute distress. HEENT: Normocephalic, normal oropharynx, moist mucous membranes. CHEST: Clear to auscultation bilaterally, no wheezes, rhonchi, or crackles. CARDIOVASCULAR: Normal heart rate and rhythm, S1 and S2 normal without murmurs. ABDOMEN: Soft, non-tender, non-distended, without organomegaly, normal bowel sounds. EXTREMITIES: No cyanosis or edema. NEUROLOGICAL: Cranial nerves grossly intact, moves all extremities without gross motor or sensory deficit.   ASSESSMENT AND PLAN: .    Assessment and Plan Assessment & Plan Borderline right ventricular systolic function Borderline normal right ventricular systolic function without significant enlargement. No heart failure symptoms. Possible link to suspected obstructive sleep apnea affecting pulmonary pressures. - Ordered home sleep test to evaluate for obstructive sleep apnea. - Repeat cardiac MRI in November to reassess right ventricular function.  Suspected obstructive sleep apnea Symptoms include snoring and waking with a dry throat, especially when supine. Potential link to borderline right ventricular function. - Ordered home sleep test to confirm diagnosis of obstructive sleep apnea.  Essential hypertension Blood pressure slightly elevated today but generally well-controlled at home with readings around 130/80 mmHg. On 10 mg of amlodipine . - Continue current antihypertensive regimen with amlodipine   10 mg. - Monitor blood pressure at home and maintain a log of readings for the month before the next visit.        Soyla Merck, MD, [facility] "

## 2024-11-21 ENCOUNTER — Other Ambulatory Visit (INDEPENDENT_AMBULATORY_CARE_PROVIDER_SITE_OTHER): Payer: Self-pay

## 2024-11-21 DIAGNOSIS — H9313 Tinnitus, bilateral: Secondary | ICD-10-CM

## 2024-11-21 NOTE — Progress Notes (Signed)
 Placed audio order for appt

## 2024-11-22 ENCOUNTER — Telehealth (INDEPENDENT_AMBULATORY_CARE_PROVIDER_SITE_OTHER): Payer: Self-pay

## 2024-11-22 NOTE — Telephone Encounter (Signed)
 Called patient per your request to schedule a Hearing Evaluation.  I was able to speak with the patient and schedule him for a hearing evaluation with Dr. Tiney and Follow Up with you on 12/31/2024.  Patient stated he would like to keep his appt on 11/27/2024 because he is still experiencing some throat issues and would like to address them at that appointment.  He is currently still on the schedule for 11/27/2024.

## 2024-11-26 ENCOUNTER — Telehealth: Payer: Self-pay

## 2024-11-27 ENCOUNTER — Ambulatory Visit (INDEPENDENT_AMBULATORY_CARE_PROVIDER_SITE_OTHER)

## 2024-11-28 ENCOUNTER — Ambulatory Visit (INDEPENDENT_AMBULATORY_CARE_PROVIDER_SITE_OTHER)

## 2024-11-28 ENCOUNTER — Encounter (INDEPENDENT_AMBULATORY_CARE_PROVIDER_SITE_OTHER): Payer: Self-pay

## 2024-11-28 VITALS — BP 113/77 | HR 76 | Wt 200.0 lb

## 2024-11-28 DIAGNOSIS — R0981 Nasal congestion: Secondary | ICD-10-CM

## 2024-11-28 DIAGNOSIS — H9313 Tinnitus, bilateral: Secondary | ICD-10-CM

## 2024-11-28 DIAGNOSIS — K219 Gastro-esophageal reflux disease without esophagitis: Secondary | ICD-10-CM

## 2024-11-28 DIAGNOSIS — R070 Pain in throat: Secondary | ICD-10-CM

## 2024-11-28 DIAGNOSIS — R09A2 Foreign body sensation, throat: Secondary | ICD-10-CM

## 2024-11-28 MED ORDER — PANTOPRAZOLE SODIUM 40 MG PO TBEC: 40.0000 mg | DELAYED_RELEASE_TABLET | Freq: Two times a day (BID) | ORAL | 3 refills | Status: AC

## 2024-11-28 NOTE — Progress Notes (Signed)
 Dear Dr. Berneta, Here is my assessment for our mutual patient, John Chen. Thank you for allowing me the opportunity to care for your patient. Please do not hesitate to contact me should you have any other questions. Sincerely, Dr. Penne Croak  Otolaryngology Clinic Note Referring provider: Dr. Berneta HPI:  Discussed the use of AI scribe software for clinical note transcription with the patient, who gave verbal consent to proceed.  History of Present Illness John Chen is a 54 year old male with chronic laryngopharyngeal reflux who presents for evaluation of persistent throat discomfort.  Throat Discomfort and Globus Sensation: - Persistent globus sensation, most pronounced in the mornings and at night - Temporarily relieved by swallowing but quickly recurs - Marked oropharyngeal dryness and pain upon waking - Progressive worsening of symptoms over time - Burning sensation in the throat  Gastroesophageal Reflux Symptoms: - Longstanding history of acid reflux - Frequent daytime episodes of heartburn - Nocturnal and daytime reflux symptoms persist despite dietary modifications - Previous gastrointestinal workup with endoscopy revealed widened gastroesophageal junction - Surgery to tighten the gastroesophageal junction was considered but not recommended after further testing on acid suppression therapy - Prior prescription acid suppression therapy improved symptoms, but symptoms recurred and worsened after discontinuation - Alginate therapy trialed without benefit - Not currently taking acid suppression medication - Dietary measures include primarily fish and chicken with salt-free seasoning; avoids spicy foods - Sometimes eats as late as 10 PM but attempts to wait 2-3 hours before lying down  Nasal and Otologic Symptoms: - Nasal congestion  - worse over the last 2 weeks since recent cold. Bilateral. Has not tried flonase . This is not a big impact on his life. The worse part  is his throat  - Intermittent tinnitus  - hearing test scheduled for one month  Independent Review of Additional Tests or Records:  Reviewed external note from referring PCP, Kremer,describing relevant history incorporated into todays evaluation. I personally reviewed and interpreted MBS from 05/2024 - grossly normal swallow and esophageal motility. No significant reflux.   PMH/Meds/All/SocHx/FamHx/ROS:   Past Medical History:  Diagnosis Date   Anemia    GERD (gastroesophageal reflux disease)    Hypertension      Past Surgical History:  Procedure Laterality Date   NO PAST SURGERIES      Family History  Problem Relation Age of Onset   Hypertension Mother    Hypertension Father    Asthma Sister    Hypertension Sister    Asthma Sister    Drug abuse Brother    Early death Brother    Heart attack Brother    Hypertension Maternal Grandmother    Alcohol abuse Neg Hx    Cancer Neg Hx    Diabetes Neg Hx    Hearing loss Neg Hx    Heart disease Neg Hx    Hyperlipidemia Neg Hx    Kidney disease Neg Hx    Stroke Neg Hx    Colon cancer Neg Hx    Stomach cancer Neg Hx    Esophageal cancer Neg Hx    Colon polyps Neg Hx      Social Connections: Moderately Integrated (10/15/2024)   Social Connection and Isolation Panel    Frequency of Communication with Friends and Family: More than three times a week    Frequency of Social Gatherings with Friends and Family: Three times a week    Attends Religious Services: 1 to 4 times per year    Active Member of Clubs  or Organizations: Yes    Attends Banker Meetings: 1 to 4 times per year    Marital Status: Divorced     Current Medications[1]   Physical Exam:   BP 113/77 (BP Location: Right Arm, Patient Position: Sitting, Cuff Size: Normal)   Pulse 76   Wt 200 lb (90.7 kg)   SpO2 94%   BMI 29.53 kg/m   The patient was awake, alert, and appropriate. The external ears were inspected, and otoscopy was performed to  evaluate the external auditory canals and tympanic membranes. The nasal cavity and septum were examined for mucosal changes, obstruction, or discharge. The oral cavity and oropharynx were inspected for mucosal lesions, infection, or tonsillar hypertrophy. The neck was palpated for lymphadenopathy, thyroid  abnormalities, or other masses. Cranial nerve function was grossly intact.  Pertinent Findings: General: Well developed, well nourished. No acute distress. Voice without hoarseness Head/Face: Normocephalic. No sinus tenderness. Facial nerve intact and equal bilaterally. No facial lacerations. Eyes: PERRL, no scleral icterus or conjunctival hemorrhage. EOMI. Ears: No gross deformity. Normal external canal. Tympanic membrane with normal landmarks bilaterally Hearing: Normal speech reception.  Nose: No gross deformity or lesions. No purulent discharge. No turbinate hypertrophy.  Mouth/Oropharynx: Lips without any lesions. Dentition good. No mucosal lesions within the oropharynx. No tonsillar enlargement, exudate, or lesions. Pharyngeal walls symmetrical. Uvula midline. Tongue midline without lesions. Larynx: See TFL if applicable Nasopharynx: See TFL if applicable Neck: Trachea midline. No masses. No thyromegaly or nodules palpated. No crepitus. Lymphatic: No lymphadenopathy in the neck. Respiratory: No stridor or distress. Room air. Cardiovascular: Regular rate and rhythm. Extremities: No edema or cyanosis. Warm and well-perfused. Skin: No scars or lesions on face or neck. Neurologic: CN II-XII grossly intact. Moving all extremities without gross abnormality. Other:  Physical Exam HEENT: Nasal congestion with midline septum. Larynx swollen with general edema. No growths or masses in throat.   Seprately Identifiable Procedures:  I personally ordered, reviewed and interpreted the following with the patient today  Given the patient's symptoms and incomplete visualization of critical sinonasal  areas with anterior rhinoscopy, a separately performed diagnostic nasal endoscopy procedure is indicated for a complete rhinologic evaluation per American Rhinologic Society recommendations (https://www.american-rhinologic.org/position-statements)  I personally performed, reviewed and interpreted the following with the patient today  Procedure Note Diagnostic Nasal Endoscopy CPT CODE -- 68768 - Mod 25  Prior to initiating any procedures, risks/benefits/alternatives were explained to the patient and verbal consent obtained.  Pre-procedure diagnosis: Concern for mass  Post-procedure diagnosis: same Indication: See pre-procedure diagnosis and physical exam above Complications: None apparent EBL: 0 mL Anesthesia: Lidocaine  4% and topical decongestant was topically sprayed in each nasal cavity  Description of Procedure:  Patient was identified. A flexible fiberoptic endoscope was utilized to evaluate the sinonasal cavities, mucosa, sinus ostia and turbinates and septum.  Overall, signs of mucosal inflammation are noted.  Also noted are inferior turbinate hypertrophy.  No mucopurulence, polyps, or masses noted.   Right Middle meatus: edema Right SE Recess: clear Left MM: edmea Left SE Recess: clear Photodocumentation was obtained.   Procedure Note Pre-procedure diagnosis:  throat pain Post-procedure diagnosis: Same Procedure: Transnasal Fiberoptic Laryngoscopy, CPT 31575 - Mod 25 Indication: throat pain, LPR Complications: None apparent EBL: 0 mL  The procedure was undertaken to further evaluate the patient's complaint of worsening throat pain, with mirror exam inadequate for appropriate examination due to gag reflex and poor patient tolerance  Procedure:  Patient was identified as correct patient. Verbal consent was obtained. The  nose was sprayed with oxymetazoline and 4% lidocaine . The The flexible laryngoscope was passed through the nose to view the nasal cavity, pharynx (oropharynx,  hypopharynx) and larynx.  The larynx was examined at rest and during multiple phonatory tasks. Documentation was obtained and reviewed with patient. The scope was removed. The patient tolerated the procedure well.  Findings: The nasal cavity and nasopharynx did not reveal any masses or lesions, mucosa appeared to be without obvious lesions. The tongue base, pharyngeal walls, piriform sinuses, vallecula, epiglottis and postcricoid region are normal in appearance EXCEPT: arytenoid edema and erythema. The visualized portion of the subglottis and proximal trachea is widely patent. The vocal folds are mobile bilaterally. There are no lesions on the free edge of the vocal folds nor elsewhere in the larynx worrisome for malignancy.    Electronically signed by: Penne Croak, DO 11/28/2024 4:44 PM   Impression & Plans:  John Chen is a 54 y.o. male  1. Globus sensation   2. Throat discomfort   3. Laryngopharyngeal reflux (LPR)   4. Chronic nasal congestion   5. Tinnitus of both ears    - Findings and diagnoses discussed in detail with the patient. - Risks, benefits, and alternatives were reviewed. Through shared decision making, the patient elects to proceed with below.  Assessment & Plan Laryngopharyngeal reflux with laryngeal edema Chronic laryngopharyngeal reflux with laryngeal edema, worsened after stopping pantoprazole . No masses or malignancy noted.  - Restarted pantoprazole  twice daily before breakfast and dinner for one month. - Eating dinner at 10 pm is contributing to his symptoms.  - Advised tapering pantoprazole  after one month, reducing to once daily or half tablet as tolerated. - Recommended Reflux Gourmet (alginate) during the day, especially when upright; suggested two packets due to body size. - Scheduled follow-up in March to reassess symptom control and medication regimen. - Advised to consult GI or general surgery if he wishes to pursue procedural options   Nasal  congestion Nasal congestion noted on examination with some subjective symptoms but without significant obstruction. No masses seen.  - Offered prescription for Flonase  if nasal obstruction develops.  - Orders placed: No orders of the defined types were placed in this encounter.  - Medications prescribed/continued/adjusted:  Meds ordered this encounter  Medications   pantoprazole  (PROTONIX ) 40 MG tablet    Sig: Take 1 tablet (40 mg total) by mouth 2 (two) times daily before a meal.    Dispense:  60 tablet    Refill:  3   - Education materials provided to the patient. - Follow up: 3 months. Patient instructed to return sooner or go to the ED if new/worsening symptoms develop.   Thank you for allowing me the opportunity to care for your patient. Please do not hesitate to contact me should you have any other questions.  Sincerely, Penne Croak, DO Otolaryngologist (ENT) [facility] Phone: [phone] Fax: [phone]  11/28/2024, 4:44 PM        [1]  Current Outpatient Medications:    amLODipine  (NORVASC ) 10 MG tablet, Take 1 tablet (10 mg total) by mouth daily., Disp: 90 tablet, Rfl: 2   pantoprazole  (PROTONIX ) 40 MG tablet, Take 1 tablet (40 mg total) by mouth 2 (two) times daily before a meal., Disp: 60 tablet, Rfl: 3  Current Facility-Administered Medications:    0.9 %  sodium chloride  infusion, 500 mL, Intravenous, Once, Stacia Glendia BRAVO, MD

## 2024-11-30 ENCOUNTER — Telehealth: Payer: Self-pay

## 2024-11-30 NOTE — Telephone Encounter (Signed)
**Note De-Identified Perri Aragones Obfuscation** I called [employer] at [phone] and was advised by RM S. that [employer] handles prior auths for this plan and he transferred my call to [facility].  I s/w Ren S who advised me that a PA is not required for CPT Code: 04199 (Itamar-HST). Call reference #: [id number]  I called the pt to advise but got no answer so I left a message asking the pt to call Macario back at Dr Laurence office at [facility] at [phone].  I have also sent a MYCHART message to the pt asking if he can come by the office to pick up a WatchPAT One-HST Device.

## 2024-12-20 ENCOUNTER — Ambulatory Visit

## 2024-12-31 ENCOUNTER — Ambulatory Visit (INDEPENDENT_AMBULATORY_CARE_PROVIDER_SITE_OTHER): Admitting: Audiology

## 2024-12-31 ENCOUNTER — Ambulatory Visit (INDEPENDENT_AMBULATORY_CARE_PROVIDER_SITE_OTHER)

## 2025-04-19 ENCOUNTER — Ambulatory Visit: Admitting: Family Medicine

## 2025-08-28 ENCOUNTER — Other Ambulatory Visit (HOSPITAL_COMMUNITY)
